# Patient Record
Sex: Male | Born: 1950
Health system: Southern US, Community
[De-identification: ages and names within clinical notes are randomized; demographics above are authoritative.]

## PROBLEM LIST (undated history)

## (undated) DIAGNOSIS — T4145XA Adverse effect of unspecified anesthetic, initial encounter: Secondary | ICD-10-CM

## (undated) DIAGNOSIS — I10 Essential (primary) hypertension: Secondary | ICD-10-CM

## (undated) DIAGNOSIS — M1990 Unspecified osteoarthritis, unspecified site: Secondary | ICD-10-CM

## (undated) DIAGNOSIS — E119 Type 2 diabetes mellitus without complications: Secondary | ICD-10-CM

## (undated) DIAGNOSIS — D1801 Hemangioma of skin and subcutaneous tissue: Secondary | ICD-10-CM

## (undated) DIAGNOSIS — M25569 Pain in unspecified knee: Secondary | ICD-10-CM

## (undated) DIAGNOSIS — T8859XA Other complications of anesthesia, initial encounter: Secondary | ICD-10-CM

## (undated) DIAGNOSIS — G8929 Other chronic pain: Secondary | ICD-10-CM

## (undated) DIAGNOSIS — K4090 Unilateral inguinal hernia, without obstruction or gangrene, not specified as recurrent: Secondary | ICD-10-CM

## (undated) DIAGNOSIS — K219 Gastro-esophageal reflux disease without esophagitis: Secondary | ICD-10-CM

## (undated) DIAGNOSIS — D649 Anemia, unspecified: Secondary | ICD-10-CM

## (undated) DIAGNOSIS — M549 Dorsalgia, unspecified: Secondary | ICD-10-CM

## (undated) DIAGNOSIS — K5790 Diverticulosis of intestine, part unspecified, without perforation or abscess without bleeding: Secondary | ICD-10-CM

## (undated) HISTORY — DX: Unilateral inguinal hernia, without obstruction or gangrene, not specified as recurrent: K40.90

## (undated) HISTORY — DX: Type 2 diabetes mellitus without complications: E11.9

## (undated) HISTORY — DX: Unspecified osteoarthritis, unspecified site: M19.90

## (undated) HISTORY — DX: Essential (primary) hypertension: I10

## (undated) HISTORY — DX: Other chronic pain: G89.29

## (undated) HISTORY — DX: Gastro-esophageal reflux disease without esophagitis: K21.9

## (undated) HISTORY — PX: HERNIA REPAIR: SHX51

## (undated) HISTORY — PX: KNEE SURGERY: SHX244

## (undated) HISTORY — DX: Dorsalgia, unspecified: M54.9

## (undated) HISTORY — PX: WISDOM TOOTH EXTRACTION: SHX21

## (undated) HISTORY — DX: Pain in unspecified knee: M25.569

## (undated) HISTORY — DX: Hemangioma of skin and subcutaneous tissue: D18.01

---

## 1994-08-16 HISTORY — PX: CATARACT EXTRACTION W/ INTRAOCULAR LENS  IMPLANT, BILATERAL: SHX1307

## 2001-04-03 ENCOUNTER — Ambulatory Visit (HOSPITAL_COMMUNITY): Admission: RE | Admit: 2001-04-03 | Discharge: 2001-04-03 | Payer: Self-pay | Admitting: Internal Medicine

## 2001-04-03 HISTORY — PX: COLONOSCOPY: SHX174

## 2003-06-25 ENCOUNTER — Ambulatory Visit (HOSPITAL_COMMUNITY): Admission: RE | Admit: 2003-06-25 | Discharge: 2003-06-25 | Payer: Self-pay | Admitting: Internal Medicine

## 2003-06-25 HISTORY — PX: ESOPHAGOGASTRODUODENOSCOPY: SHX1529

## 2010-09-06 ENCOUNTER — Encounter: Payer: Self-pay | Admitting: Internal Medicine

## 2011-07-13 ENCOUNTER — Telehealth: Payer: Self-pay

## 2011-07-13 NOTE — Telephone Encounter (Signed)
LMOM for pt to call to be triaged for a colonoscopy. ( He previously had EGD in 06/25/2003 but no colonoscopy)

## 2011-07-19 NOTE — Telephone Encounter (Signed)
Letter mailed to pt to call.  

## 2012-10-09 ENCOUNTER — Other Ambulatory Visit (HOSPITAL_COMMUNITY): Payer: Self-pay | Admitting: Physician Assistant

## 2012-10-09 DIAGNOSIS — R1031 Right lower quadrant pain: Secondary | ICD-10-CM

## 2012-10-11 ENCOUNTER — Ambulatory Visit (HOSPITAL_COMMUNITY)
Admission: RE | Admit: 2012-10-11 | Discharge: 2012-10-11 | Disposition: A | Discharge status: Home / Self Care | Payer: BC Managed Care – PPO | Source: Ambulatory Visit | Attending: Physician Assistant | Admitting: Physician Assistant

## 2012-10-11 DIAGNOSIS — R1031 Right lower quadrant pain: Secondary | ICD-10-CM | POA: Insufficient documentation

## 2012-10-11 DIAGNOSIS — K4090 Unilateral inguinal hernia, without obstruction or gangrene, not specified as recurrent: Secondary | ICD-10-CM | POA: Insufficient documentation

## 2012-10-11 DIAGNOSIS — K5730 Diverticulosis of large intestine without perforation or abscess without bleeding: Secondary | ICD-10-CM | POA: Insufficient documentation

## 2012-10-11 DIAGNOSIS — K429 Umbilical hernia without obstruction or gangrene: Secondary | ICD-10-CM | POA: Insufficient documentation

## 2012-10-11 DIAGNOSIS — N200 Calculus of kidney: Secondary | ICD-10-CM | POA: Insufficient documentation

## 2012-10-11 MED ORDER — IOHEXOL 300 MG/ML  SOLN
100.0000 mL | Freq: Once | INTRAMUSCULAR | Status: AC | PRN
  Administered 2012-10-11: 100 mL via INTRAVENOUS

## 2012-10-24 ENCOUNTER — Ambulatory Visit (INDEPENDENT_AMBULATORY_CARE_PROVIDER_SITE_OTHER): Payer: BC Managed Care – PPO | Admitting: Surgery

## 2012-10-31 ENCOUNTER — Ambulatory Visit (INDEPENDENT_AMBULATORY_CARE_PROVIDER_SITE_OTHER): Payer: BC Managed Care – PPO | Admitting: Urgent Care

## 2012-10-31 ENCOUNTER — Encounter: Payer: Self-pay | Admitting: Urgent Care

## 2012-10-31 VITALS — BP 153/86 | HR 125 | Temp 97.2°F | Ht 66.0 in | Wt 157.6 lb

## 2012-10-31 DIAGNOSIS — K5730 Diverticulosis of large intestine without perforation or abscess without bleeding: Secondary | ICD-10-CM

## 2012-10-31 DIAGNOSIS — R1031 Right lower quadrant pain: Secondary | ICD-10-CM

## 2012-10-31 DIAGNOSIS — Z1211 Encounter for screening for malignant neoplasm of colon: Secondary | ICD-10-CM

## 2012-10-31 MED ORDER — PEG 3350-KCL-NA BICARB-NACL 420 G PO SOLR: 4000.0000 mL | ORAL | Status: DC

## 2012-10-31 NOTE — Progress Notes (Signed)
Referring Provider: Lenise Herald, PA-C Primary Care Physician:  Lenise Herald, PA-C Primary Gastroenterologist:  Dr. Jena Gauss  Chief Complaint  Patient presents with  . Colonoscopy    HPI:  Lee Christensen is a 62 y.o. male here as a referral from Dr. Loreta Ave for colonoscopy.  He has been having abdominal pain so he was brought in for an office visiti to discuss prior to colonoscopy.  Pain is in his right lower quadrant & right groin x 32month.  He has been eating bland foods like jello, soup,chicken, & eggs.  Pain is constant.  Pain 6/10 at worst.  +Nagging pain.  Radiates to right testicle.  He has seen fresh bright red blood with wiping on tissue for years.  His weight is stable.   Denies heartburn, indigestion, nausea, vomiting, dysphagia, odynophagia or anorexia.  Tylenol helps the pain.  Lidocaine patch helps pain.  Pain seems to be getting better.  He has been referred by Dr Sherwood Gambler for upcoming appt with Dr Michaell Cowing, a surgeon in [place].    CT A/P withIv/oral contrast-Small umbilical and left inguinal hernias containing fat. Tiny nonobstructing left renal calculus in right renal cyst. Question small hiatal hernia. Minimal sigmoid diverticulosis. No acute intra abdominal or intrapelvic abnormalities.  Past Medical History  Diagnosis Date  . Hypertension   . Arthritis   . GERD (gastroesophageal reflux disease)   . Chronic back pain     Past Surgical History  Procedure Laterality Date  . Knee surgery      left x 2  . Hernia repair      ? lower abd  . Wisdom tooth extraction    . Esophagogastroduodenoscopy  06/25/03    ZOX:WRUEAV colored tongue-NO BARRETTs (EROSIVE ESOPHAGITIS)/small HH  . Colonoscopy  04/03/01    WUJ:[id number] hemorrhoids otherwise normal    Current Outpatient Prescriptions  Medication Sig Dispense Refill  . hydrochlorothiazide (HYDRODIURIL) 25 MG tablet Take 25 mg by mouth daily.       . irbesartan (AVAPRO) 150 MG tablet Take 150 mg by mouth daily.       .  lansoprazole (PREVACID) 30 MG capsule Take 30 mg by mouth daily.      Marland Kitchen lidocaine (LIDODERM) 5 % Place 1 patch onto the skin daily.       . polyethylene glycol-electrolytes (TRILYTE) 420 G solution Take 4,000 mLs by mouth as directed.  4000 mL  0   No current facility-administered medications for this visit.    Allergies as of 10/31/2012 - Review Complete 10/31/2012  Allergen Reaction Noted  . Penicillins Rash 10/31/2012    Family History:There is no known family history of colorectal carcinoma , liver disease, or inflammatory bowel disease.   Problem Relation Age of Onset  . Prostate cancer Father   . Lung cancer Mother     History   Social History  . Marital Status: Married    Spouse Name: N/A    Number of Children: 2  . Years of Education: N/A   Occupational History  . retired; [employer]    Social History Main Topics  . Smoking status: Former Smoker    Types: Cigarettes  . Smokeless tobacco: Former Neurosurgeon    Quit date: 11/01/1994  . Alcohol Use: Yes     Comment: rare Beer every couple months  . Drug Use: No  . Sexually Active: Not on file   Other Topics Concern  . Not on file   Social History Narrative   Lives w/ wife  Review of Systems: Gen: Denies any fever, chills, sweats, anorexia, fatigue, weakness, malaise, weight loss, and sleep disorder CV: Denies chest pain, angina, palpitations, syncope, orthopnea, PND, peripheral edema, and claudication. Resp: Denies dyspnea at rest, dyspnea with exercise, cough, sputum, wheezing, coughing up blood, and pleurisy. GI: Denies vomiting blood, jaundice, and fecal incontinence.   Denies dysphagia or odynophagia. GU : Denies urinary burning, blood in urine, urinary frequency, urinary hesitancy, nocturnal urination, and urinary incontinence. MS: chronic low back pain & sciatica  Derm: Denies rash, itching, dry skin, hives, moles, warts, or unhealing ulcers.  Psych: Denies depression, anxiety, memory loss, suicidal  ideation, hallucinations, paranoia, and confusion. Heme: Denies bruising, bleeding, and enlarged lymph nodes. Neuro:  Denies any headaches, dizziness, paresthesias. Endo:  Denies any problems with DM, thyroid, adrenal function.  Physical Exam: BP 153/86  Pulse 125  Temp(Src) 97.2 F (36.2 C) (Oral)  Ht 5\' 6"  (1.676 m)  Wt 157 lb 9.6 oz (71.487 kg)  BMI 25.45 kg/m2 No LMP for male patient. General:   Alert,  Well-developed, well-nourished, pleasant and cooperative in NAD.  Accompanied by his lovely wife. Head:  Normocephalic and atraumatic. Eyes:  Sclera clear, no icterus.   Conjunctiva pink. Ears:  Normal auditory acuity. Nose:  No deformity, discharge, or lesions. Mouth:  No deformity or lesions,oropharynx pink & moist. Neck:  Supple; no masses or thyromegaly. Lungs:  Clear throughout to auscultation.   No wheezes, crackles, or rhonchi. No acute distress. Heart:  Regular rate and rhythm; no murmurs, clicks, rubs,  or gallops. Abdomen:  Normal bowel sounds.  No bruits.  Soft, non-tender and non-distended without masses, hepatosplenomegaly.  +easily reducible small umbilical hernia.   No RLQ/inguinal hernia appreciated.  +Point tenderness to small musculature deficit RLQ. No guarding or rebound tenderness.   Rectal:  Deferred. Msk:  Symmetrical without gross deformities. Normal posture. Pulses:  Normal pulses noted. Extremities:  No clubbing or edema. Neurologic:  Alert and oriented x4;  grossly normal neurologically. Skin:  Intact without significant lesions or rashes. Lymph Nodes:  No significant cervical adenopathy. Psych:  Alert and cooperative. Normal mood and affect.

## 2012-10-31 NOTE — Patient Instructions (Addendum)
Colonoscopy with Dr Jena Gauss Continue metamucil or fiber supplement of choice daily High fiber diet Diverticulosis Diverticulosis is a common condition that develops when small pouches (diverticula) form in the wall of the colon. The risk of diverticulosis increases with age. It happens more often in people who eat a low-fiber diet. Most individuals with diverticulosis have no symptoms. Those individuals with symptoms usually experience abdominal pain, constipation, or loose stools (diarrhea). HOME CARE INSTRUCTIONS   Increase the amount of fiber in your diet as directed by your caregiver or dietician. This may reduce symptoms of diverticulosis.  Your caregiver may recommend taking a dietary fiber supplement.  Drink at least 6 to 8 glasses of water each day to prevent constipation.  Try not to strain when you have a bowel movement.  Your caregiver may recommend avoiding nuts and seeds to prevent complications, although this is still an uncertain benefit.  Only take over-the-counter or prescription medicines for pain, discomfort, or fever as directed by your caregiver. FOODS WITH HIGH FIBER CONTENT INCLUDE:  Fruits. Apple, peach, pear, tangerine, raisins, prunes.  Vegetables. Brussels sprouts, asparagus, broccoli, cabbage, carrot, cauliflower, romaine lettuce, spinach, summer squash, tomato, winter squash, zucchini.  Starchy Vegetables. Baked beans, kidney beans, lima beans, split peas, lentils, potatoes (with skin).  Grains. Whole wheat bread, brown rice, bran flake cereal, plain oatmeal, white rice, shredded wheat, bran muffins. SEEK IMMEDIATE MEDICAL CARE IF:   You develop increasing pain or severe bloating.  You have an oral temperature above 102 F (38.9 C), not controlled by medicine.  You develop vomiting or bowel movements that are bloody or black. Document Released: 04/29/2004 Document Revised: 10/25/2011 Document Reviewed: 12/31/2009 [facility] Patient Information 2013  [place], [place]. High-Fiber Diet Fiber is found in fruits, vegetables, and grains. A high-fiber diet encourages the addition of more whole grains, legumes, fruits, and vegetables in your diet. The recommended amount of fiber for adult males is 38 g per day. For adult females, it is 25 g per day. Pregnant and lactating women should get 28 g of fiber per day. If you have a digestive or bowel problem, ask your caregiver for advice before adding high-fiber foods to your diet. Eat a variety of high-fiber foods instead of only a select few type of foods.  PURPOSE  To increase stool bulk.  To make bowel movements more regular to prevent constipation.  To lower cholesterol.  To prevent overeating. WHEN IS THIS DIET USED?  It may be used if you have constipation and hemorrhoids.  It may be used if you have uncomplicated diverticulosis (intestine condition) and irritable bowel syndrome.  It may be used if you need help with weight management.  It may be used if you want to add it to your diet as a protective measure against atherosclerosis, diabetes, and cancer. SOURCES OF FIBER  Whole-grain breads and cereals.  Fruits, such as apples, oranges, bananas, berries, prunes, and pears.  Vegetables, such as green peas, carrots, sweet potatoes, beets, broccoli, cabbage, spinach, and artichokes.  Legumes, such split peas, soy, lentils.  Almonds. FIBER CONTENT IN FOODS Starches and Grains / Dietary Fiber (g)  Cheerios, 1 cup / 3 g  Corn Flakes cereal, 1 cup / 0.7 g  Rice crispy treat cereal, 1 cup / 0.3 g  Instant oatmeal (cooked),  cup / 2 g  Frosted wheat cereal, 1 cup / 5.1 g  Brown, long-grain rice (cooked), 1 cup / 3.5 g  White, long-grain rice (cooked), 1 cup / 0.6 g  Enriched  macaroni (cooked), 1 cup / 2.5 g Legumes / Dietary Fiber (g)  Baked beans (canned, plain, or vegetarian),  cup / 5.2 g  Kidney beans (canned),  cup / 6.8 g  Pinto beans (cooked),  cup / 5.5  g Breads and Crackers / Dietary Fiber (g)  Plain or honey graham crackers, 2 squares / 0.7 g  Saltine crackers, 3 squares / 0.3 g  Plain, salted pretzels, 10 pieces / 1.8 g  Whole-wheat bread, 1 slice / 1.9 g  White bread, 1 slice / 0.7 g  Raisin bread, 1 slice / 1.2 g  Plain bagel, 3 oz / 2 g  Flour tortilla, 1 oz / 0.9 g  Corn tortilla, 1 small / 1.5 g  Hamburger or hotdog bun, 1 small / 0.9 g Fruits / Dietary Fiber (g)  Apple with skin, 1 medium / 4.4 g  Sweetened applesauce,  cup / 1.5 g  Banana,  medium / 1.5 g  Grapes, 10 grapes / 0.4 g  Orange, 1 small / 2.3 g  Raisin, 1.5 oz / 1.6 g  Melon, 1 cup / 1.4 g Vegetables / Dietary Fiber (g)  Green beans (canned),  cup / 1.3 g  Carrots (cooked),  cup / 2.3 g  Broccoli (cooked),  cup / 2.8 g  Peas (cooked),  cup / 4.4 g  Mashed potatoes,  cup / 1.6 g  Lettuce, 1 cup / 0.5 g  Corn (canned),  cup / 1.6 g  Tomato,  cup / 1.1 g Document Released: 08/02/2005 Document Revised: 02/01/2012 Document Reviewed: 11/04/2011 [facility] Patient Information 2013 [place], [place].

## 2012-11-01 ENCOUNTER — Encounter: Payer: Self-pay | Admitting: Urgent Care

## 2012-11-01 NOTE — Assessment & Plan Note (Signed)
Diverticulosis literature Continue metamucil or fiber supplement of choice daily High fiber diet

## 2012-11-01 NOTE — Assessment & Plan Note (Addendum)
I suspect either muscle strain RLQ or small hernia not picked up on CT as culprit of RLQ pain.  Cannot r/o testicular source.  If pain persists, I fully agree with surgical consult after colonoscopy.  Advised to have complete testicular exam through PCP as well.  If severe pain, to ER

## 2012-11-01 NOTE — Assessment & Plan Note (Signed)
Colonoscopy with Dr Jena Gauss.  I have discussed risks & benefits which include, but are not limited to, bleeding, infection, perforation & drug reaction.  The patient agrees with this plan & written consent will be obtained.

## 2012-11-02 ENCOUNTER — Encounter (HOSPITAL_COMMUNITY): Payer: Self-pay | Admitting: Pharmacy Technician

## 2012-11-02 NOTE — Progress Notes (Signed)
Faxed to PCP

## 2012-11-09 ENCOUNTER — Encounter (HOSPITAL_COMMUNITY)
Admission: RE | Disposition: A | Discharge status: Home / Self Care | Payer: Self-pay | Source: Ambulatory Visit | Attending: Internal Medicine

## 2012-11-09 ENCOUNTER — Encounter (HOSPITAL_COMMUNITY): Payer: Self-pay | Admitting: *Deleted

## 2012-11-09 ENCOUNTER — Ambulatory Visit (HOSPITAL_COMMUNITY)
Admission: RE | Admit: 2012-11-09 | Discharge: 2012-11-09 | Disposition: A | Discharge status: Home / Self Care | Payer: BC Managed Care – PPO | Source: Ambulatory Visit | Attending: Internal Medicine | Admitting: Internal Medicine

## 2012-11-09 DIAGNOSIS — K5730 Diverticulosis of large intestine without perforation or abscess without bleeding: Secondary | ICD-10-CM

## 2012-11-09 DIAGNOSIS — Z87891 Personal history of nicotine dependence: Secondary | ICD-10-CM | POA: Insufficient documentation

## 2012-11-09 DIAGNOSIS — K429 Umbilical hernia without obstruction or gangrene: Secondary | ICD-10-CM | POA: Insufficient documentation

## 2012-11-09 DIAGNOSIS — M129 Arthropathy, unspecified: Secondary | ICD-10-CM | POA: Insufficient documentation

## 2012-11-09 DIAGNOSIS — Z880 Allergy status to penicillin: Secondary | ICD-10-CM | POA: Insufficient documentation

## 2012-11-09 DIAGNOSIS — I10 Essential (primary) hypertension: Secondary | ICD-10-CM | POA: Insufficient documentation

## 2012-11-09 DIAGNOSIS — K219 Gastro-esophageal reflux disease without esophagitis: Secondary | ICD-10-CM | POA: Insufficient documentation

## 2012-11-09 DIAGNOSIS — K4090 Unilateral inguinal hernia, without obstruction or gangrene, not specified as recurrent: Secondary | ICD-10-CM | POA: Insufficient documentation

## 2012-11-09 DIAGNOSIS — Z79899 Other long term (current) drug therapy: Secondary | ICD-10-CM | POA: Insufficient documentation

## 2012-11-09 DIAGNOSIS — Z1211 Encounter for screening for malignant neoplasm of colon: Secondary | ICD-10-CM

## 2012-11-09 HISTORY — PX: COLONOSCOPY: SHX5424

## 2012-11-09 SURGERY — COLONOSCOPY
Anesthesia: Moderate Sedation

## 2012-11-09 MED ORDER — MIDAZOLAM HCL 5 MG/5ML IJ SOLN
INTRAMUSCULAR | Status: AC
  Filled 2012-11-09: qty 10

## 2012-11-09 MED ORDER — MEPERIDINE HCL 100 MG/ML IJ SOLN
INTRAMUSCULAR | Status: AC
  Filled 2012-11-09: qty 1

## 2012-11-09 MED ORDER — SODIUM CHLORIDE 0.9 % IV SOLN
INTRAVENOUS | Status: DC
  Administered 2012-11-09: 10:00:00 via INTRAVENOUS

## 2012-11-09 MED ORDER — ONDANSETRON HCL 4 MG/2ML IJ SOLN
INTRAMUSCULAR | Status: DC | PRN
  Administered 2012-11-09: 4 mg via INTRAVENOUS

## 2012-11-09 MED ORDER — MEPERIDINE HCL 100 MG/ML IJ SOLN
INTRAMUSCULAR | Status: DC | PRN
  Administered 2012-11-09: 50 mg via INTRAVENOUS
  Administered 2012-11-09: 25 mg via INTRAVENOUS
  Administered 2012-11-09: 50 mg via INTRAVENOUS

## 2012-11-09 MED ORDER — ONDANSETRON HCL 4 MG/2ML IJ SOLN
INTRAMUSCULAR | Status: AC
  Filled 2012-11-09: qty 2

## 2012-11-09 MED ORDER — STERILE WATER FOR IRRIGATION IR SOLN
Status: DC | PRN
  Administered 2012-11-09: 12:00:00

## 2012-11-09 MED ORDER — MIDAZOLAM HCL 5 MG/5ML IJ SOLN
INTRAMUSCULAR | Status: DC | PRN
  Administered 2012-11-09: 2 mg via INTRAVENOUS
  Administered 2012-11-09: 1 mg via INTRAVENOUS
  Administered 2012-11-09: 2 mg via INTRAVENOUS

## 2012-11-09 NOTE — H&P (View-Only) (Signed)
Referring Provider: Mann, Benjamin, PA-C Primary Care Physician:  MANN, BENJAMIN, PA-C Primary Gastroenterologist:  Dr. Rourk  Chief Complaint  Patient presents with  . Colonoscopy    HPI:  Lee Christensen is a 62 y.o. male here as a referral from Dr. Mann for colonoscopy.  He has been having abdominal pain so he was brought in for an office visiti to discuss prior to colonoscopy.  Pain is in his right lower quadrant & right groin x 1month.  He has been eating bland foods like jello, soup,chicken, & eggs.  Pain is constant.  Pain 6/10 at worst.  +Nagging pain.  Radiates to right testicle.  He has seen fresh bright red blood with wiping on tissue for years.  His weight is stable.   Denies heartburn, indigestion, nausea, vomiting, dysphagia, odynophagia or anorexia.  Tylenol helps the pain.  Lidocaine patch helps pain.  Pain seems to be getting better.  He has been referred by Dr Fusco for upcoming appt with Dr Gross, a surgeon in [place].    CT A/P withIv/oral contrast-Small umbilical and left inguinal hernias containing fat. Tiny nonobstructing left renal calculus in right renal cyst. Question small hiatal hernia. Minimal sigmoid diverticulosis. No acute intra abdominal or intrapelvic abnormalities.  Past Medical History  Diagnosis Date  . Hypertension   . Arthritis   . GERD (gastroesophageal reflux disease)   . Chronic back pain     Past Surgical History  Procedure Laterality Date  . Knee surgery      left x 2  . Hernia repair      ? lower abd  . Wisdom tooth extraction    . Esophagogastroduodenoscopy  06/25/03    RMR:salmon colored tongue-NO BARRETTs (EROSIVE ESOPHAGITIS)/small HH  . Colonoscopy  04/03/01    RMR:internal hemorrhoids otherwise normal    Current Outpatient Prescriptions  Medication Sig Dispense Refill  . hydrochlorothiazide (HYDRODIURIL) 25 MG tablet Take 25 mg by mouth daily.       . irbesartan (AVAPRO) 150 MG tablet Take 150 mg by mouth daily.       .  lansoprazole (PREVACID) 30 MG capsule Take 30 mg by mouth daily.      . lidocaine (LIDODERM) 5 % Place 1 patch onto the skin daily.       . polyethylene glycol-electrolytes (TRILYTE) 420 G solution Take 4,000 mLs by mouth as directed.  4000 mL  0   No current facility-administered medications for this visit.    Allergies as of 10/31/2012 - Review Complete 10/31/2012  Allergen Reaction Noted  . Penicillins Rash 10/31/2012    Family History:There is no known family history of colorectal carcinoma , liver disease, or inflammatory bowel disease.   Problem Relation Age of Onset  . Prostate cancer Father   . Lung cancer Mother     History   Social History  . Marital Status: Married    Spouse Name: N/A    Number of Children: 2  . Years of Education: N/A   Occupational History  . retired; hardwood flooring    Social History Main Topics  . Smoking status: Former Smoker    Types: Cigarettes  . Smokeless tobacco: Former User    Quit date: 11/01/1994  . Alcohol Use: Yes     Comment: rare Beer every couple months  . Drug Use: No  . Sexually Active: Not on file   Other Topics Concern  . Not on file   Social History Narrative   Lives w/ wife      Review of Systems: Gen: Denies any fever, chills, sweats, anorexia, fatigue, weakness, malaise, weight loss, and sleep disorder CV: Denies chest pain, angina, palpitations, syncope, orthopnea, PND, peripheral edema, and claudication. Resp: Denies dyspnea at rest, dyspnea with exercise, cough, sputum, wheezing, coughing up blood, and pleurisy. GI: Denies vomiting blood, jaundice, and fecal incontinence.   Denies dysphagia or odynophagia. GU : Denies urinary burning, blood in urine, urinary frequency, urinary hesitancy, nocturnal urination, and urinary incontinence. MS: chronic low back pain & sciatica  Derm: Denies rash, itching, dry skin, hives, moles, warts, or unhealing ulcers.  Psych: Denies depression, anxiety, memory loss, suicidal  ideation, hallucinations, paranoia, and confusion. Heme: Denies bruising, bleeding, and enlarged lymph nodes. Neuro:  Denies any headaches, dizziness, paresthesias. Endo:  Denies any problems with DM, thyroid, adrenal function.  Physical Exam: BP 153/86  Pulse 125  Temp(Src) 97.2 F (36.2 C) (Oral)  Ht 5' 6" (1.676 m)  Wt 157 lb 9.6 oz (71.487 kg)  BMI 25.45 kg/m2 No LMP for male patient. General:   Alert,  Well-developed, well-nourished, pleasant and cooperative in NAD.  Accompanied by his lovely wife. Head:  Normocephalic and atraumatic. Eyes:  Sclera clear, no icterus.   Conjunctiva pink. Ears:  Normal auditory acuity. Nose:  No deformity, discharge, or lesions. Mouth:  No deformity or lesions,oropharynx pink & moist. Neck:  Supple; no masses or thyromegaly. Lungs:  Clear throughout to auscultation.   No wheezes, crackles, or rhonchi. No acute distress. Heart:  Regular rate and rhythm; no murmurs, clicks, rubs,  or gallops. Abdomen:  Normal bowel sounds.  No bruits.  Soft, non-tender and non-distended without masses, hepatosplenomegaly.  +easily reducible small umbilical hernia.   No RLQ/inguinal hernia appreciated.  +Point tenderness to small musculature deficit RLQ. No guarding or rebound tenderness.   Rectal:  Deferred. Msk:  Symmetrical without gross deformities. Normal posture. Pulses:  Normal pulses noted. Extremities:  No clubbing or edema. Neurologic:  Alert and oriented x4;  grossly normal neurologically. Skin:  Intact without significant lesions or rashes. Lymph Nodes:  No significant cervical adenopathy. Psych:  Alert and cooperative. Normal mood and affect.  

## 2012-11-09 NOTE — Op Note (Signed)
[facility] [address] [place], [address]   COLONOSCOPY PROCEDURE REPORT  PATIENT: Lee Christensen, Lee Christensen  MR#:         [id number] BIRTHDATE: 05/08/1951 , 62  yrs. old GENDER: Male ENDOSCOPIST: R.  Roetta Sessions, MD FACP FACG REFERRED BY:  Artis Delay, M.D. PROCEDURE DATE:  11/09/2012 PROCEDURE:     Screening ileocolonoscopy  INDICATIONS:  Average risk screening examination  INFORMED CONSENT:  The risks, benefits, alternatives and imponderables including but not limited to bleeding, perforation as well as the possibility of a missed lesion have been reviewed.  The potential for biopsy, lesion removal, etc. have also been discussed.  Questions have been answered.  All parties agreeable. Please see the history and physical in the medical record for more information.  MEDICATIONS: Versed 5 mg IV and Demerol 125 mg IV in divided doses. Zofran 4 mg IV  DESCRIPTION OF PROCEDURE:  After a digital rectal exam was performed, the EC-[id number] ([id number])  colonoscope was advanced from the anus through the rectum and colon to the area of the cecum, ileocecal valve and appendiceal orifice.  The cecum was deeply intubated.  These structures were well-seen and photographed for the record.  From the level of the cecum and ileocecal valve, the scope was slowly and cautiously withdrawn.  The mucosal surfaces were carefully surveyed utilizing scope tip deflection to facilitate fold flattening as needed.  The scope was pulled down into the rectum where a thorough examination including retroflexion was performed.    FINDINGS:  Adequate preparation. Normal rectum. Scattered left-sided diverticula; the remainder of colonic mucosa appeared normal. The distal 10 cm of terminal ileum mucosa also appeared normal.  THERAPEUTIC / DIAGNOSTIC MANEUVERS PERFORMED:  None  COMPLICATIONS: None  CECAL WITHDRAWAL TIME:  9 minutes  IMPRESSION:  Colonic diverticulosis.  RECOMMENDATIONS:   Repeat  screening colonoscopy in 10 years. Agree with seeing a general surgeon to further evaluate groin pain.   _______________________________ eSigned:  R. Roetta Sessions, MD FACP [facility] 11/09/2012 12:25 PM   CC:    PATIENT NAME:  Quitman, Norberto MR#: [id number]

## 2012-11-09 NOTE — Interval H&P Note (Signed)
History and Physical Interval Note:  11/09/2012 11:47 AM  Lee Christensen  has presented today for surgery, with the diagnosis of SCREENING COLONOSCOPY  The various methods of treatment have been discussed with the patient and family. After consideration of risks, benefits and other options for treatment, the patient has consented to  Procedure(s) with comments: COLONOSCOPY (N/A) - 10:30 as a surgical intervention .  The patient's history has been reviewed, patient examined, no change in status, stable for surgery.  I have reviewed the patient's chart and labs.  Questions were answered to the patient's satisfaction.     Lee Christensen Colonoscopy per plan. Patient states of right lower quadrant bowel pain has improved significantly but he is planning to see a general surgeon anyway.The risks, benefits, limitations, alternatives and imponderables have been reviewed with the patient. Questions have been answered. All parties are agreeable.

## 2012-11-13 ENCOUNTER — Encounter (HOSPITAL_COMMUNITY): Payer: Self-pay | Admitting: Internal Medicine

## 2012-12-04 ENCOUNTER — Encounter (INDEPENDENT_AMBULATORY_CARE_PROVIDER_SITE_OTHER): Payer: Self-pay | Admitting: Surgery

## 2012-12-04 ENCOUNTER — Ambulatory Visit (INDEPENDENT_AMBULATORY_CARE_PROVIDER_SITE_OTHER): Payer: BC Managed Care – PPO | Admitting: Surgery

## 2012-12-04 VITALS — BP 150/82 | HR 83 | Temp 98.1°F | Resp 18 | Ht 66.0 in | Wt 157.0 lb

## 2012-12-04 DIAGNOSIS — K4090 Unilateral inguinal hernia, without obstruction or gangrene, not specified as recurrent: Secondary | ICD-10-CM

## 2012-12-04 DIAGNOSIS — K4020 Bilateral inguinal hernia, without obstruction or gangrene, not specified as recurrent: Secondary | ICD-10-CM | POA: Insufficient documentation

## 2012-12-04 DIAGNOSIS — S39013A Strain of muscle, fascia and tendon of pelvis, initial encounter: Secondary | ICD-10-CM | POA: Insufficient documentation

## 2012-12-04 DIAGNOSIS — IMO0002 Reserved for concepts with insufficient information to code with codable children: Secondary | ICD-10-CM

## 2012-12-04 DIAGNOSIS — K429 Umbilical hernia without obstruction or gangrene: Secondary | ICD-10-CM

## 2012-12-04 HISTORY — DX: Unilateral inguinal hernia, without obstruction or gangrene, not specified as recurrent: K40.90

## 2012-12-04 NOTE — Progress Notes (Signed)
Subjective:     Patient ID: Lee Christensen, male   DOB: 1951-03-10, 62 y.o.   MRN: [id number]  HPI  Gerik Coberly Birkland  May 08, 1951 [id number]  Patient Care Team: Elfredia Nevins, MD as PCP - General (Internal Medicine) Corbin Ade, MD as Consulting Physician (Gastroenterology) Lenise Herald, PA-C as Physician Assistant (Family Medicine) Corrie Mckusick, MD as Attending Physician (Family Medicine)  This patient is a 62 y.o.male who presents today for surgical evaluation at the request of Dwyane Luo / Dr. Phillips Odor.   Reason for visit: Right groin strain.  Question of hernia.  Umbilical and left inguinal hernias found on CAT scan.  Pleasant active male.  Recently retired.  Had a prior (inguinal hernia repair with mesh in the late 1990s.  He cannot recall the surgeon.  Noted a sharp lower groin pain in February.  Saw his primary care physician.  CT scan done.  Left kidney stone 3mm at the hilum.  Small left inguinal and umbilical hernias.  No right inguinal hernia seen.  The pain is gone down to more of an intermittent state.  It is worse when he coughs or when he is lifting or active.  He was sent to me for evaluation to see if he had hernia problems requiring surgery.  He used to walk a few miles a day.  However, his left knee has more severe [place] to arthritis and cannot walk more than a mile now.  Has a few bowel movements in the morning but otherwise okay.  Diverticulosis on recent colonoscopy.  No abdominal surgeries aside from a right inguinal hernia repair.  Patient Active Problem List  Diagnosis  . Encounter for screening colonoscopy  . RLQ abdominal pain  . Diverticulosis of sigmoid colon    Past Medical History  Diagnosis Date  . Hypertension   . Arthritis   . GERD (gastroesophageal reflux disease)   . Chronic back pain   . Diabetes mellitus without complication     Borderline.  . Diverticulitis   . Knee pain     Past Surgical History  Procedure Laterality Date  . Knee surgery     left x 2  . Hernia repair      ? lower abd  . Wisdom tooth extraction    . Esophagogastroduodenoscopy  06/25/03    NGE:XBMWUX colored tongue-NO BARRETTs (EROSIVE ESOPHAGITIS)/small HH  . Colonoscopy  04/03/01    LKG:MWNUUVOZ hemorrhoids otherwise normal  . Colonoscopy N/A 11/09/2012    Procedure: COLONOSCOPY;  Surgeon: Corbin Ade, MD;  Location: [facility];  Service: Endoscopy;  Laterality: N/A;  10:30  . Cataract extraction w/ intraocular lens  implant, bilateral  1996    History   Social History  . Marital Status: Married    Spouse Name: N/A    Number of Children: 2  . Years of Education: N/A   Occupational History  . retired; [employer]    Social History Main Topics  . Smoking status: Former Smoker    Types: Cigarettes    Quit date: 08/16/1996  . Smokeless tobacco: Former Neurosurgeon    Types: Chew    Quit date: 11/01/1994  . Alcohol Use: 0.6 oz/week    1 Cans of beer per week     Comment: Rarely.  . Drug Use: No  . Sexually Active: Not on file   Other Topics Concern  . Not on file   Social History Narrative   Lives w/ wife    Family History  Problem Relation Age of Onset  . Prostate cancer Father   . Diabetes Father   . Hypertension Father   . Stroke Father   . Lung cancer Mother     Current Outpatient Prescriptions  Medication Sig Dispense Refill  . acetaminophen (TYLENOL) 500 MG tablet Take 500 mg by mouth every 6 (six) hours as needed for pain.      . hydrochlorothiazide (HYDRODIURIL) 25 MG tablet Take 25 mg by mouth daily.       . irbesartan (AVAPRO) 150 MG tablet Take 150 mg by mouth daily.       . iron polysaccharides (NIFEREX) 150 MG capsule Take 150 mg by mouth 2 (two) times daily.      . lansoprazole (PREVACID) 30 MG capsule Take 30 mg by mouth daily.      Marland Kitchen lidocaine (LIDODERM) 5 % Place 1 patch onto the skin daily as needed (Pain).       . psyllium (METAMUCIL SMOOTH TEXTURE) 28 % packet Take 1 packet by mouth daily.       No current  facility-administered medications for this visit.     Allergies  Allergen Reactions  . Penicillins Rash    BP 150/82  Pulse 83  Temp(Src) 98.1 F (36.7 C) (Temporal)  Resp 18  Ht 5\' 6"  (1.676 m)  Wt 157 lb (71.215 kg)  BMI 25.35 kg/m2  No results found.   Review of Systems  Constitutional: Negative for fever, chills and diaphoresis.  HENT: Negative for nosebleeds, sore throat, facial swelling, mouth sores, trouble swallowing and ear discharge.   Eyes: Negative for photophobia, discharge and visual disturbance.  Respiratory: Negative for choking, chest tightness, shortness of breath and stridor.   Cardiovascular: Negative for chest pain and palpitations.  Gastrointestinal: Negative for nausea, vomiting, abdominal pain, diarrhea, constipation, blood in stool, abdominal distention, anal bleeding and rectal pain.  Genitourinary: Negative for dysuria, urgency, difficulty urinating and testicular pain.  Musculoskeletal: Negative for myalgias, back pain, arthralgias and gait problem.  Skin: Negative for color change, pallor, rash and wound.  Neurological: Negative for dizziness, speech difficulty, weakness, numbness and headaches.  Hematological: Negative for adenopathy. Does not bruise/bleed easily.  Psychiatric/Behavioral: Negative for hallucinations, confusion and agitation.       Objective:   Physical Exam  Constitutional: He is oriented to person, place, and time. He appears well-developed and well-nourished. No distress.  HENT:  Head: Normocephalic.  Mouth/Throat: Oropharynx is clear and moist. No oropharyngeal exudate.  Eyes: Conjunctivae and EOM are normal. Pupils are equal, round, and reactive to light. No scleral icterus.  Neck: Normal range of motion. Neck supple. No tracheal deviation present.  Cardiovascular: Normal rate, regular rhythm and intact distal pulses.   Pulmonary/Chest: Effort normal and breath sounds normal. No respiratory distress.  Abdominal: Soft.  Bowel sounds are normal. He exhibits no distension and no abdominal bruit. There is no rigidity, no guarding, no CVA tenderness, no tenderness at McBurney's point and negative Murphy's sign. A hernia is present. Hernia confirmed positive in the ventral area and confirmed positive in the left inguinal area. Hernia confirmed negative in the right inguinal area.    Musculoskeletal: Normal range of motion. He exhibits no tenderness.  Lymphadenopathy:    He has no cervical adenopathy.       Right: No inguinal adenopathy present.       Left: No inguinal adenopathy present.  Neurological: He is alert and oriented to person, place, and time. No cranial nerve deficit. He exhibits normal  muscle tone. Coordination normal.  Skin: Skin is warm and dry. No rash noted. He is not diaphoretic. No erythema. No pallor.  Psychiatric: He has a normal mood and affect. His behavior is normal. Judgment and thought content normal.       Assessment:     Right groin strain and pain.  Most consistent with musculoskeletal strain of old scar.  No strong evidence of hernia.  Small but definite left inguinal hernia.  Small umbilical hernia.     Plan:     At this point, I think his right groin strain is due to a pull.  Recommended heat and anti-inflammatories (naproxen two by mouth twice a day).  Do that for three weeks.  That should help musculoskeletal discomfort calm down.  It could be a subtle recurrent RIH hernia, but exam and CT scan not definitive.  The most aggressive option would be to do diagnostic laparoscopy at repair [facility] if seen.  However, because the pain is getting less, I think it is reasonable to follow with anti-inflammatory regimen and see if it improves.  No strong trigger point that would benefit from a nerve block at this time.  At some point, his left inguinal hernia will need to be repaired.  I did offer diagnostic laparoscopy with repair of hernias.  I could do a primary repair of the iumbilical  hernia as well.  He seemed inclined to maybe do it now vs. waiting until the fall.  He wanted to leave the summer opened be active.  I did discuss procedure with him:  The anatomy & physiology of the abdominal wall and pelvic floor was discussed.  The pathophysiology of hernias in the inguinal and pelvic region was discussed.  Natural history risks such as progressive enlargement, pain, incarceration & strangulation was discussed.   Contributors to complications such as smoking, obesity, diabetes, prior surgery, etc were discussed.    I feel the risks of no intervention will lead to serious problems that outweigh the operative risks; therefore, I recommended surgery to reduce and repair the hernia.  I explained laparoscopic techniques with possible need for an open approach.  I noted usual use of mesh to patch and/or buttress hernia repair  Risks such as bleeding, infection, abscess, need for further treatment, heart attack, death, and other risks were discussed.  I noted a good likelihood this will help address the problem.   Goals of post-operative recovery were discussed as well.  Possibility that this will not correct all symptoms was explained.  I stressed the importance of low-impact activity, aggressive pain control, avoiding constipation, & not pushing through pain to minimize risk of post-operative chronic pain or injury. Possibility of reherniation was discussed.  We will work to minimize complications.     An educational handout further explaining the pathology & treatment options was given as well.  Questions were answered.  The patient expresses understanding & wishes to think about things before planning surgery.

## 2012-12-04 NOTE — Patient Instructions (Signed)
See the Handout(s) we gave you.  Consider surgery To repair the hernia in her left groin and make sure you do not have her recurrent right groin hernia.  Okay to delay surgery until soreness in the right side calms down if you wish.  Use naproxen 500 mg twice a day and heat six times a day to help.  Please call our office at [phone] if you wish to schedule surgery or if you have further questions / concerns.   Inguinal Strain Your exam shows you have an inguinal strain. This is also known as a pulled groin. This injury is usually due to a pull or partial tear to a muscle or tendon in the groin area. Most groin pulls take several weeks to heal completely. There may be pain with lifting your leg or walking during much of your recovery. Treatment for groin strains includes:  Rest and avoid lifting or performing activities that increase your pain.  Apply ice packs for 20-30 minutes every few hours to reduce pain and swelling over the next 2-3 days.  Medicine to reduce pain and inflammation is often prescribed. HOME CARE INSTRUCTIONS  While most strains in the groin area will heal with rest, you should also watch for any signs of a more serious condition.  SEEK IMMEDIATE MEDICAL CARE IF:   You notice unusual swelling or bulging in the groin.  You have pain or swelling in the testicle.  Blood in your urine.  Marked increased pain.  Weakness or numbness of your leg or abdominal pain. MAKE SURE YOU:   Understand these instructions.  Will watch your condition.  Will get help right away if you are not doing well or get worse. Document Released: 09/09/2004 Document Revised: 10/25/2011 Document Reviewed: 12/07/2007 [facility] Patient Information 2013 [place], [place].  Hernia A hernia occurs when an internal organ pushes out through a weak spot in the abdominal wall. Hernias most commonly occur in the groin and around the navel. Hernias often can be pushed back into place (reduced). Most  hernias tend to get worse over time. Some abdominal hernias can get stuck in the opening (irreducible or incarcerated hernia) and cannot be reduced. An irreducible abdominal hernia which is tightly squeezed into the opening is at risk for impaired blood supply (strangulated hernia). A strangulated hernia is a medical emergency. Because of the risk for an irreducible or strangulated hernia, surgery may be recommended to repair a hernia. CAUSES   Heavy lifting.  Prolonged coughing.  Straining to have a bowel movement.  A cut (incision) made during an abdominal surgery. HOME CARE INSTRUCTIONS   Bed rest is not required. You may continue your normal activities.  Avoid lifting more than 10 pounds (4.5 kg) or straining.  Cough gently. If you are a smoker it is best to stop. Even the best hernia repair can break down with the continual strain of coughing. Even if you do not have your hernia repaired, a cough will continue to aggravate the problem.  Do not wear anything tight over your hernia. Do not try to keep it in with an outside bandage or truss. These can damage abdominal contents if they are trapped within the hernia sac.  Eat a normal diet.  Avoid constipation. Straining over long periods of time will increase hernia size and encourage breakdown of repairs. If you cannot do this with diet alone, stool softeners may be used. SEEK IMMEDIATE MEDICAL CARE IF:   You have a fever.  You develop increasing  abdominal pain.  You feel nauseous or vomit.  Your hernia is stuck outside the abdomen, looks discolored, feels hard, or is tender.  You have any changes in your bowel habits or in the hernia that are unusual for you.  You have increased pain or swelling around the hernia.  You cannot push the hernia back in place by applying gentle pressure while lying down. MAKE SURE YOU:   Understand these instructions.  Will watch your condition.  Will get help right away if you are not  doing well or get worse. Document Released: 08/02/2005 Document Revised: 10/25/2011 Document Reviewed: 03/21/2008 [facility] Patient Information 2013 [place], [place].

## 2012-12-06 ENCOUNTER — Other Ambulatory Visit (INDEPENDENT_AMBULATORY_CARE_PROVIDER_SITE_OTHER): Payer: Self-pay | Admitting: Surgery

## 2012-12-12 ENCOUNTER — Encounter (HOSPITAL_COMMUNITY): Payer: Self-pay | Admitting: Pharmacy Technician

## 2012-12-13 ENCOUNTER — Telehealth (INDEPENDENT_AMBULATORY_CARE_PROVIDER_SITE_OTHER): Payer: Self-pay

## 2012-12-13 NOTE — Telephone Encounter (Signed)
Pt called asking Dr Michaell Cowing could take off a cyst of his left shoulder while he is already scheduled for bilateral and umbilical hernia repair to be done on 12/29/12. The pt states he has had this cyst for a long time and it's not infected now. The pt has been told by his PCP numerous times to have it removed. I asked Dr Michaell Cowing about removing the cyst and he said for the pt to show him the day of surgery. The pt understands.

## 2012-12-15 ENCOUNTER — Other Ambulatory Visit (HOSPITAL_COMMUNITY): Payer: Self-pay | Admitting: Surgery

## 2012-12-15 NOTE — Patient Instructions (Addendum)
20 Lee Christensen  12/15/2012   Your procedure is scheduled on: 12-29-2012  Report to [facility] at 530 AM.  Call this number if you have problems the morning of surgery [phone]   Remember:   Do not eat food or drink liquids :After Midnight.     Take these medicines the morning of surgery with A SIP OF WATER: prevacid                                SEE [facility] PREPARING FOR SURGERY SHEET   Do not wear jewelry, make-up or nail polish.  Do not wear lotions, powders, or perfumes. You may wear deodorant.   Men may shave face and neck.  Do not bring valuables to the hospital.  Contacts, dentures or bridgework may not be worn into surgery.  Leave suitcase in the car. After surgery it may be brought to your room.  For patients admitted to the hospital, checkout time is 11:00 AM the day of discharge.   Patients discharged the day of surgery will not be allowed to drive home.  Name and phone number of your driver:  Special Instructions: N/A   Please read over the following fact sheets that you were given: MRSA Information.  Call Cain Sieve RN pre op nurse if needed [phone]    FAILURE TO FOLLOW THESE INSTRUCTIONS MAY RESULT IN THE CANCELLATION OF YOUR SURGERY. PATIENT SIGNATURE___________________________________________

## 2012-12-18 ENCOUNTER — Ambulatory Visit (HOSPITAL_COMMUNITY)
Admission: RE | Admit: 2012-12-18 | Discharge: 2012-12-18 | Disposition: A | Discharge status: Home / Self Care | Payer: BC Managed Care – PPO | Source: Ambulatory Visit | Attending: Surgery | Admitting: Surgery

## 2012-12-18 ENCOUNTER — Encounter (HOSPITAL_COMMUNITY): Payer: Self-pay

## 2012-12-18 ENCOUNTER — Encounter (HOSPITAL_COMMUNITY)
Admission: RE | Admit: 2012-12-18 | Discharge: 2012-12-18 | Disposition: A | Discharge status: Home / Self Care | Payer: BC Managed Care – PPO | Source: Ambulatory Visit | Attending: Surgery | Admitting: Surgery

## 2012-12-18 DIAGNOSIS — Z01818 Encounter for other preprocedural examination: Secondary | ICD-10-CM | POA: Insufficient documentation

## 2012-12-18 DIAGNOSIS — I10 Essential (primary) hypertension: Secondary | ICD-10-CM | POA: Insufficient documentation

## 2012-12-18 DIAGNOSIS — K4090 Unilateral inguinal hernia, without obstruction or gangrene, not specified as recurrent: Secondary | ICD-10-CM | POA: Insufficient documentation

## 2012-12-18 DIAGNOSIS — Z01812 Encounter for preprocedural laboratory examination: Secondary | ICD-10-CM | POA: Insufficient documentation

## 2012-12-18 DIAGNOSIS — K429 Umbilical hernia without obstruction or gangrene: Secondary | ICD-10-CM | POA: Insufficient documentation

## 2012-12-18 DIAGNOSIS — Z01811 Encounter for preprocedural respiratory examination: Secondary | ICD-10-CM | POA: Insufficient documentation

## 2012-12-18 DIAGNOSIS — Z87891 Personal history of nicotine dependence: Secondary | ICD-10-CM | POA: Insufficient documentation

## 2012-12-18 HISTORY — DX: Anemia, unspecified: D64.9

## 2012-12-18 HISTORY — DX: Other complications of anesthesia, initial encounter: T88.59XA

## 2012-12-18 HISTORY — DX: Diverticulosis of intestine, part unspecified, without perforation or abscess without bleeding: K57.90

## 2012-12-18 HISTORY — DX: Adverse effect of unspecified anesthetic, initial encounter: T41.45XA

## 2012-12-18 LAB — CBC
HCT: 39 % (ref 39.0–52.0)
Hemoglobin: 12.8 g/dL — ABNORMAL LOW (ref 13.0–17.0)
MCH: 28.1 pg (ref 26.0–34.0)
MCHC: 32.8 g/dL (ref 30.0–36.0)
MCV: 85.7 fL (ref 78.0–100.0)
RDW: 17 % — ABNORMAL HIGH (ref 11.5–15.5)

## 2012-12-18 LAB — BASIC METABOLIC PANEL
BUN: 21 mg/dL (ref 6–23)
Calcium: 9.9 mg/dL (ref 8.4–10.5)
Creatinine, Ser: 0.92 mg/dL (ref 0.50–1.35)
GFR calc Af Amer: >90 mL/min (ref >90)
GFR calc non Af Amer: 89 mL/min — ABNORMAL LOW (ref >90)
Glucose, Bld: 122 mg/dL — ABNORMAL HIGH (ref 70–99)
Potassium: 4.6 mEq/L (ref 3.5–5.1)

## 2012-12-18 NOTE — Telephone Encounter (Signed)
We will try to add on to his consent orders on the day of surgery.

## 2012-12-21 LAB — MRSA CULTURE

## 2012-12-28 MED ORDER — GENTAMICIN SULFATE 40 MG/ML IJ SOLN
360.0000 mg | INTRAVENOUS | Status: AC
  Administered 2012-12-29: 360 mg via INTRAVENOUS
  Filled 2012-12-28: qty 9

## 2012-12-29 ENCOUNTER — Encounter (HOSPITAL_COMMUNITY): Payer: Self-pay | Admitting: *Deleted

## 2012-12-29 ENCOUNTER — Encounter (HOSPITAL_COMMUNITY): Payer: Self-pay | Admitting: Certified Registered Nurse Anesthetist

## 2012-12-29 ENCOUNTER — Ambulatory Visit (HOSPITAL_COMMUNITY)
Admission: RE | Admit: 2012-12-29 | Discharge: 2012-12-29 | Disposition: A | Discharge status: Home / Self Care | Payer: BC Managed Care – PPO | Source: Ambulatory Visit | Attending: Surgery | Admitting: Surgery

## 2012-12-29 ENCOUNTER — Encounter (HOSPITAL_COMMUNITY)
Admission: RE | Disposition: A | Discharge status: Home / Self Care | Payer: Self-pay | Source: Ambulatory Visit | Attending: Surgery

## 2012-12-29 ENCOUNTER — Ambulatory Visit (HOSPITAL_COMMUNITY): Payer: BC Managed Care – PPO | Admitting: Certified Registered Nurse Anesthetist

## 2012-12-29 DIAGNOSIS — R222 Localized swelling, mass and lump, trunk: Secondary | ICD-10-CM | POA: Insufficient documentation

## 2012-12-29 DIAGNOSIS — D213 Benign neoplasm of connective and other soft tissue of thorax: Secondary | ICD-10-CM

## 2012-12-29 DIAGNOSIS — D1779 Benign lipomatous neoplasm of other sites: Secondary | ICD-10-CM | POA: Insufficient documentation

## 2012-12-29 DIAGNOSIS — K4020 Bilateral inguinal hernia, without obstruction or gangrene, not specified as recurrent: Secondary | ICD-10-CM | POA: Insufficient documentation

## 2012-12-29 DIAGNOSIS — K429 Umbilical hernia without obstruction or gangrene: Secondary | ICD-10-CM

## 2012-12-29 DIAGNOSIS — K4090 Unilateral inguinal hernia, without obstruction or gangrene, not specified as recurrent: Secondary | ICD-10-CM

## 2012-12-29 DIAGNOSIS — S39013D Strain of muscle, fascia and tendon of pelvis, subsequent encounter: Secondary | ICD-10-CM

## 2012-12-29 DIAGNOSIS — I10 Essential (primary) hypertension: Secondary | ICD-10-CM | POA: Insufficient documentation

## 2012-12-29 DIAGNOSIS — K219 Gastro-esophageal reflux disease without esophagitis: Secondary | ICD-10-CM | POA: Insufficient documentation

## 2012-12-29 DIAGNOSIS — Z79899 Other long term (current) drug therapy: Secondary | ICD-10-CM | POA: Insufficient documentation

## 2012-12-29 DIAGNOSIS — D1809 Hemangioma of other sites: Secondary | ICD-10-CM | POA: Insufficient documentation

## 2012-12-29 DIAGNOSIS — E119 Type 2 diabetes mellitus without complications: Secondary | ICD-10-CM | POA: Insufficient documentation

## 2012-12-29 HISTORY — PX: LAPAROSCOPIC INGUINAL HERNIA WITH UMBILICAL HERNIA: SHX5658

## 2012-12-29 HISTORY — PX: MASS EXCISION: SHX2000

## 2012-12-29 HISTORY — PX: INSERTION OF MESH: SHX5868

## 2012-12-29 LAB — GLUCOSE, CAPILLARY: Glucose-Capillary: 103 mg/dL — ABNORMAL HIGH (ref 70–99)

## 2012-12-29 SURGERY — LAPAROSCOPIC INGUINAL HERNIA WITH UMBILICAL HERNIA
Anesthesia: General | Site: Groin | Laterality: Left | Wound class: Clean

## 2012-12-29 MED ORDER — NAPROXEN 500 MG PO TABS: 500.0000 mg | ORAL_TABLET | Freq: Two times a day (BID) | ORAL | Status: DC

## 2012-12-29 MED ORDER — SODIUM CHLORIDE 0.9 % IJ SOLN: 3.0000 mL | Freq: Two times a day (BID) | INTRAMUSCULAR | Status: DC

## 2012-12-29 MED ORDER — SODIUM CHLORIDE 0.9 % IJ SOLN: 3.0000 mL | INTRAMUSCULAR | Status: DC | PRN

## 2012-12-29 MED ORDER — IRBESARTAN 150 MG PO TABS
150.0000 mg | ORAL_TABLET | Freq: Every morning | ORAL | Status: DC
  Filled 2012-12-29: qty 1

## 2012-12-29 MED ORDER — FENTANYL CITRATE 0.05 MG/ML IJ SOLN
INTRAMUSCULAR | Status: DC | PRN
  Administered 2012-12-29 (×4): 50 ug via INTRAVENOUS

## 2012-12-29 MED ORDER — CLINDAMYCIN PHOSPHATE 900 MG/50ML IV SOLN
900.0000 mg | INTRAVENOUS | Status: AC
  Administered 2012-12-29: 900 mg via INTRAVENOUS

## 2012-12-29 MED ORDER — LACTATED RINGERS IR SOLN
Status: DC | PRN
  Administered 2012-12-29: 1000 mL

## 2012-12-29 MED ORDER — ACETAMINOPHEN 325 MG PO TABS: 650.0000 mg | ORAL_TABLET | ORAL | Status: DC | PRN

## 2012-12-29 MED ORDER — FENTANYL CITRATE 0.05 MG/ML IJ SOLN: 25.0000 ug | INTRAMUSCULAR | Status: DC | PRN

## 2012-12-29 MED ORDER — OXYCODONE HCL 5 MG PO TABS
ORAL_TABLET | ORAL | Status: AC
  Administered 2012-12-29: 5 mg via ORAL
  Filled 2012-12-29: qty 1

## 2012-12-29 MED ORDER — LIDOCAINE HCL (CARDIAC) 20 MG/ML IV SOLN
INTRAVENOUS | Status: DC | PRN
  Administered 2012-12-29: 100 mg via INTRAVENOUS

## 2012-12-29 MED ORDER — HYDROMORPHONE HCL PF 1 MG/ML IJ SOLN
0.2500 mg | INTRAMUSCULAR | Status: DC | PRN
  Administered 2012-12-29 (×2): 0.5 mg via INTRAVENOUS

## 2012-12-29 MED ORDER — KETOROLAC TROMETHAMINE 30 MG/ML IJ SOLN
INTRAMUSCULAR | Status: DC | PRN
  Administered 2012-12-29: 30 mg via INTRAVENOUS

## 2012-12-29 MED ORDER — CLINDAMYCIN PHOSPHATE 900 MG/50ML IV SOLN
INTRAVENOUS | Status: AC
  Filled 2012-12-29: qty 50

## 2012-12-29 MED ORDER — ONDANSETRON HCL 4 MG/2ML IJ SOLN: 4.0000 mg | Freq: Four times a day (QID) | INTRAMUSCULAR | Status: DC | PRN

## 2012-12-29 MED ORDER — PHENYLEPHRINE HCL 10 MG/ML IJ SOLN
INTRAMUSCULAR | Status: DC | PRN
  Administered 2012-12-29: 80 ug via INTRAVENOUS

## 2012-12-29 MED ORDER — OXYCODONE HCL 5 MG PO TABS: 5.0000 mg | ORAL_TABLET | ORAL | Status: DC | PRN

## 2012-12-29 MED ORDER — SODIUM CHLORIDE 0.9 % IV SOLN: 250.0000 mL | INTRAVENOUS | Status: DC | PRN

## 2012-12-29 MED ORDER — DEXAMETHASONE SODIUM PHOSPHATE 10 MG/ML IJ SOLN
INTRAMUSCULAR | Status: DC | PRN
  Administered 2012-12-29: 10 mg via INTRAVENOUS

## 2012-12-29 MED ORDER — BUPIVACAINE-EPINEPHRINE 0.25% -1:200000 IJ SOLN
INTRAMUSCULAR | Status: AC
  Filled 2012-12-29: qty 1

## 2012-12-29 MED ORDER — BUPIVACAINE-EPINEPHRINE 0.25% -1:200000 IJ SOLN
INTRAMUSCULAR | Status: DC | PRN
  Administered 2012-12-29: 70 mL

## 2012-12-29 MED ORDER — PROMETHAZINE HCL 25 MG/ML IJ SOLN
6.2500 mg | INTRAMUSCULAR | Status: DC | PRN
  Administered 2012-12-29: 12:00:00 via INTRAVENOUS
  Filled 2012-12-29: qty 1

## 2012-12-29 MED ORDER — SUCCINYLCHOLINE CHLORIDE 20 MG/ML IJ SOLN
INTRAMUSCULAR | Status: DC | PRN
  Administered 2012-12-29: 100 mg via INTRAVENOUS

## 2012-12-29 MED ORDER — ROCURONIUM BROMIDE 100 MG/10ML IV SOLN
INTRAVENOUS | Status: DC | PRN
  Administered 2012-12-29: 10 mg via INTRAVENOUS
  Administered 2012-12-29: 5 mg via INTRAVENOUS
  Administered 2012-12-29: 40 mg via INTRAVENOUS

## 2012-12-29 MED ORDER — ACETAMINOPHEN 10 MG/ML IV SOLN
INTRAVENOUS | Status: AC
  Filled 2012-12-29: qty 100

## 2012-12-29 MED ORDER — ONDANSETRON HCL 4 MG/2ML IJ SOLN
INTRAMUSCULAR | Status: DC | PRN
  Administered 2012-12-29: 4 mg via INTRAVENOUS

## 2012-12-29 MED ORDER — ACETAMINOPHEN 10 MG/ML IV SOLN
INTRAVENOUS | Status: DC | PRN
  Administered 2012-12-29: 1000 mg via INTRAVENOUS

## 2012-12-29 MED ORDER — BUPIVACAINE-EPINEPHRINE PF 0.25-1:200000 % IJ SOLN
INTRAMUSCULAR | Status: AC
  Filled 2012-12-29: qty 30

## 2012-12-29 MED ORDER — PROPOFOL 10 MG/ML IV BOLUS
INTRAVENOUS | Status: DC | PRN
  Administered 2012-12-29: 150 mg via INTRAVENOUS

## 2012-12-29 MED ORDER — PANTOPRAZOLE SODIUM 40 MG PO TBEC
40.0000 mg | DELAYED_RELEASE_TABLET | Freq: Every day | ORAL | Status: DC
  Administered 2012-12-29: 40 mg via ORAL
  Filled 2012-12-29 (×2): qty 1

## 2012-12-29 MED ORDER — LACTATED RINGERS IV SOLN
INTRAVENOUS | Status: DC | PRN
  Administered 2012-12-29: 07:00:00 via INTRAVENOUS
  Administered 2012-12-29: 1000 mL

## 2012-12-29 MED ORDER — HYDROMORPHONE HCL PF 1 MG/ML IJ SOLN
INTRAMUSCULAR | Status: AC
  Filled 2012-12-29: qty 1

## 2012-12-29 MED ORDER — GLYCOPYRROLATE 0.2 MG/ML IJ SOLN
INTRAMUSCULAR | Status: DC | PRN
  Administered 2012-12-29: 0.6 mg via INTRAVENOUS

## 2012-12-29 MED ORDER — ACETAMINOPHEN 650 MG RE SUPP
650.0000 mg | RECTAL | Status: DC | PRN
  Filled 2012-12-29: qty 1

## 2012-12-29 MED ORDER — NEOSTIGMINE METHYLSULFATE 1 MG/ML IJ SOLN
INTRAMUSCULAR | Status: DC | PRN
  Administered 2012-12-29: 5 mg via INTRAVENOUS

## 2012-12-29 MED ORDER — MIDAZOLAM HCL 5 MG/5ML IJ SOLN
INTRAMUSCULAR | Status: DC | PRN
  Administered 2012-12-29: 2 mg via INTRAVENOUS

## 2012-12-29 MED ORDER — 0.9 % SODIUM CHLORIDE (POUR BTL) OPTIME
TOPICAL | Status: DC | PRN
  Administered 2012-12-29: 1000 mL

## 2012-12-29 SURGICAL SUPPLY — 41 items
CANISTER SUCTION 2500CC (MISCELLANEOUS) ×4 IMPLANT
CHLORAPREP W/TINT 26ML (MISCELLANEOUS) ×4 IMPLANT
CLOTH BEACON ORANGE TIMEOUT ST (SAFETY) ×4 IMPLANT
DECANTER SPIKE VIAL GLASS SM (MISCELLANEOUS) ×4 IMPLANT
DISSECTOR BLUNT TIP ENDO 5MM (MISCELLANEOUS) IMPLANT
DRAPE LAPAROSCOPIC ABDOMINAL (DRAPES) ×4 IMPLANT
DRAPE WARM FLUID 44X44 (DRAPE) ×4 IMPLANT
DRSG TEGADERM 2-3/8X2-3/4 SM (GAUZE/BANDAGES/DRESSINGS) ×8 IMPLANT
DRSG TEGADERM 4X4.75 (GAUZE/BANDAGES/DRESSINGS) ×5 IMPLANT
ELECT REM PT RETURN 9FT ADLT (ELECTROSURGICAL) ×4
ELECTRODE REM PT RTRN 9FT ADLT (ELECTROSURGICAL) ×3 IMPLANT
GAUZE SPONGE 2X2 8PLY STRL LF (GAUZE/BANDAGES/DRESSINGS) IMPLANT
GLOVE BIOGEL PI IND STRL 7.0 (GLOVE) ×3 IMPLANT
GLOVE BIOGEL PI INDICATOR 7.0 (GLOVE) ×1
GLOVE ECLIPSE 8.0 STRL XLNG CF (GLOVE) ×4 IMPLANT
GLOVE INDICATOR 8.0 STRL GRN (GLOVE) ×8 IMPLANT
GOWN STRL NON-REIN LRG LVL3 (GOWN DISPOSABLE) ×3 IMPLANT
GOWN STRL REIN XL XLG (GOWN DISPOSABLE) ×10 IMPLANT
KIT BASIN OR (CUSTOM PROCEDURE TRAY) ×4 IMPLANT
MARKER SKIN DUAL TIP RULER LAB (MISCELLANEOUS) ×3 IMPLANT
MESH ULTRAPRO 6X6 15CM15CM (Mesh General) ×2 IMPLANT
NDL INSUFFLATION 14GA 120MM (NEEDLE) IMPLANT
NEEDLE INSUFFLATION 14GA 120MM (NEEDLE) IMPLANT
PENCIL BUTTON HOLSTER BLD 10FT (ELECTRODE) ×1 IMPLANT
SET IRRIG TUBING LAPAROSCOPIC (IRRIGATION / IRRIGATOR) ×1 IMPLANT
SLEEVE Z-THREAD 5X100MM (TROCAR) ×3 IMPLANT
SPONGE GAUZE 2X2 STER 10/PKG (GAUZE/BANDAGES/DRESSINGS) ×1
STRIP CLOSURE SKIN 1/2X4 (GAUZE/BANDAGES/DRESSINGS) ×1 IMPLANT
SUT MNCRL AB 4-0 PS2 18 (SUTURE) ×5 IMPLANT
SUT PDS AB 0 CT1 36 (SUTURE) ×1 IMPLANT
SUT VIC AB 3-0 SH 8-18 (SUTURE) ×1 IMPLANT
TACKER 5MM HERNIA 3.5CML NAB (ENDOMECHANICALS) IMPLANT
TOWEL OR 17X26 10 PK STRL BLUE (TOWEL DISPOSABLE) ×4 IMPLANT
TOWEL OR NON WOVEN STRL DISP B (DISPOSABLE) ×1 IMPLANT
TRAY LAP CHOLE (CUSTOM PROCEDURE TRAY) ×4 IMPLANT
TROCAR SLEEVE XCEL 5X75 (ENDOMECHANICALS) ×1 IMPLANT
TROCAR XCEL BLADELESS 5X75MML (TROCAR) ×1 IMPLANT
TROCAR XCEL BLUNT TIP 100MML (ENDOMECHANICALS) ×4 IMPLANT
TROCAR Z-THREAD FIOS 5X100MM (TROCAR) ×3 IMPLANT
TUBING FILTER THERMOFLATOR (ELECTROSURGICAL) ×1 IMPLANT
TUBING INSUFFLATION 10FT LAP (TUBING) ×3 IMPLANT

## 2012-12-29 NOTE — Interval H&P Note (Signed)
History and Physical Interval Note:  12/29/2012 7:24 AM  Holt A Odom  has presented today for surgery, with the diagnosis of bilateral inguinal hernia and umbilical hernia   The various methods of treatment have been discussed with the patient and family. After consideration of risks, benefits and other options for treatment, the patient has consented to  Procedure(s): LAPAROSCOPIC INGUINAL HERNIA WITH UMBILICAL HERNIA (Bilateral) INSERTION OF MESH (Bilateral) as a surgical intervention .  The patient also wants a SQ mass removed off his chest.  The patient's history has been reviewed, patient examined, no change in status, stable for surgery.  I have reviewed the patient's chart and labs.  Questions were answered to the patient's satisfaction.    The pathophysiology of skin & subcutaneous masses was discussed.  Natural history risks without surgery were discussed.  I recommended surgery to remove the mass.  I explained the technique of removal with use of local anesthesia & possible need for more aggressive sedation/anesthesia for patient comfort.    Risks such as bleeding, infection, heart attack, death, and other risks were discussed.  I noted a good likelihood this will help address the problem.   Possibility that this will not correct all symptoms was explained. Possibility of regrowth/recurrence of the mass was discussed.  We will work to minimize complications. Questions were answered.  The patient expresses understanding & wishes to proceed with surgery.   The anatomy & physiology of the abdominal wall and pelvic floor was discussed.  The pathophysiology of hernias in the inguinal and pelvic region was discussed.  Natural history risks such as progressive enlargement, pain, incarceration & strangulation was discussed.   Contributors to complications such as smoking, obesity, diabetes, prior surgery, etc were discussed.    I feel the risks of no intervention will lead to serious problems that  outweigh the operative risks; therefore, I recommended surgery to reduce and repair the hernia.  I explained laparoscopic techniques with possible need for an open approach.  I noted usual use of mesh to patch and/or buttress hernia repair  Risks such as bleeding, infection, abscess, need for further treatment, heart attack, death, and other risks were discussed.  I noted a good likelihood this will help address the problem.   Goals of post-operative recovery were discussed as well.  Possibility that this will not correct all symptoms was explained.  I stressed the importance of low-impact activity, aggressive pain control, avoiding constipation, & not pushing through pain to minimize risk of post-operative chronic pain or injury. Possibility of reherniation was discussed.  We will work to minimize complications.     An educational handout further explaining the pathology & treatment options was given as well.  Questions were answered.  The patient expresses understanding & wishes to proceed with surgery.     Aspynn Clover C.

## 2012-12-29 NOTE — Anesthesia Preprocedure Evaluation (Addendum)
Anesthesia Evaluation  Patient identified by MRN, date of birth, ID band Patient awake  General Assessment Comment:H/O slow to emerge from anesthesia.  Reviewed: Allergy & Precautions, H&P , NPO status , Patient's Chart, lab work & pertinent test results, reviewed documented beta blocker date and time   Airway Mallampati: II TM Distance: >3 FB Neck ROM: full    Dental  (+) Dental Advisory Given Discolored left upper incisor. Mildly loose front lower incisor.:   Pulmonary former smoker,  CXR 12-18-12 No acute disease.  90 pack year h/o smoking. Quit 1998 breath sounds clear to auscultation  Pulmonary exam normal       Cardiovascular Exercise Tolerance: Good hypertension, On Medications Rhythm:regular Rate:Normal     Neuro/Psych Chronic back pain.  Neuromuscular disease negative psych ROS   GI/Hepatic Neg liver ROS, GERD-  Medicated,  Endo/Other  diabetes, Type 2  Renal/GU negative Renal ROS  negative genitourinary   Musculoskeletal   Abdominal   Peds  Hematology negative hematology ROS (+) anemia ,   Anesthesia Other Findings   Reproductive/Obstetrics negative OB ROS                         Anesthesia Physical Anesthesia Plan  ASA: II  Anesthesia Plan: General ETT   Post-op Pain Management:    Induction:   Airway Management Planned:   Additional Equipment:   Intra-op Plan:   Post-operative Plan:   Informed Consent: I have reviewed the patients History and Physical, chart, labs and discussed the procedure including the risks, benefits and alternatives for the proposed anesthesia with the patient or authorized representative who has indicated his/her understanding and acceptance.   Dental Advisory Given  Plan Discussed with: CRNA  Anesthesia Plan Comments:         Anesthesia Quick Evaluation

## 2012-12-29 NOTE — Interval H&P Note (Signed)
History and Physical Interval Note:  12/29/2012 7:17 AM  Lee Christensen  has presented today for surgery, with the diagnosis of bilateral inguinal hernia and umbilical hernia   The various methods of treatment have been discussed with the patient and family. After consideration of risks, benefits and other options for treatment, the patient has consented to  Procedure(s): LAPAROSCOPIC INGUINAL HERNIA WITH UMBILICAL HERNIA (Bilateral) INSERTION OF MESH (Bilateral) as a surgical intervention .  The patient's history has been reviewed, patient examined, no change in status, stable for surgery.  I have reviewed the patient's chart and labs.  Questions were answered to the patient's satisfaction.     [name] C.

## 2012-12-29 NOTE — H&P (View-Only) (Signed)
Subjective:     Patient ID: Lee Christensen, male   DOB: 08/12/1951, 62 y.o.   MRN: [id number]  HPI  Lee Christensen  09/18/1950 [id number]  Patient Care Team: Lawrence Fusco, MD as PCP - General (Internal Medicine) Robert M Rourk, MD as Consulting Physician (Gastroenterology) Benjamin Mann, PA-C as Physician Assistant (Family Medicine) John C Golding, MD as Attending Physician (Family Medicine)  This patient is a 62 y.o.male who presents today for surgical evaluation at the request of Ben Mann / Dr. Golding.   Reason for visit: Right groin strain.  Question of hernia.  Umbilical and left inguinal hernias found on CAT scan.  Pleasant active male.  Recently retired.  Had a prior (inguinal hernia repair with mesh in the late 1990s.  He cannot recall the surgeon.  Noted a sharp lower groin pain in February.  Saw his primary care physician.  CT scan done.  Left kidney stone 3mm at the hilum.  Small left inguinal and umbilical hernias.  No right inguinal hernia seen.  The pain is gone down to more of an intermittent state.  It is worse when he coughs or when he is lifting or active.  He was sent to me for evaluation to see if he had hernia problems requiring surgery.  He used to walk a few miles a day.  However, his left knee has more severe us to arthritis and cannot walk more than a mile now.  Has a few bowel movements in the morning but otherwise okay.  Diverticulosis on recent colonoscopy.  No abdominal surgeries aside from a right inguinal hernia repair.  Patient Active Problem List  Diagnosis  . Encounter for screening colonoscopy  . RLQ abdominal pain  . Diverticulosis of sigmoid colon    Past Medical History  Diagnosis Date  . Hypertension   . Arthritis   . GERD (gastroesophageal reflux disease)   . Chronic back pain   . Diabetes mellitus without complication     Borderline.  . Diverticulitis   . Knee pain     Past Surgical History  Procedure Laterality Date  . Knee surgery     left x 2  . Hernia repair      ? lower abd  . Wisdom tooth extraction    . Esophagogastroduodenoscopy  06/25/03    RMR:salmon colored tongue-NO BARRETTs (EROSIVE ESOPHAGITIS)/small HH  . Colonoscopy  04/03/01    RMR:internal hemorrhoids otherwise normal  . Colonoscopy N/A 11/09/2012    Procedure: COLONOSCOPY;  Surgeon: Robert M Rourk, MD;  Location: [facility];  Service: Endoscopy;  Laterality: N/A;  10:30  . Cataract extraction w/ intraocular lens  implant, bilateral  1996    History   Social History  . Marital Status: Married    Spouse Name: N/A    Number of Children: 2  . Years of Education: N/A   Occupational History  . retired; hardwood flooring    Social History Main Topics  . Smoking status: Former Smoker    Types: Cigarettes    Quit date: 08/16/1996  . Smokeless tobacco: Former User    Types: Chew    Quit date: 11/01/1994  . Alcohol Use: 0.6 oz/week    1 Cans of beer per week     Comment: Rarely.  . Drug Use: No  . Sexually Active: Not on file   Other Topics Concern  . Not on file   Social History Narrative   Lives w/ wife    Family History    Problem Relation Age of Onset  . Prostate cancer Father   . Diabetes Father   . Hypertension Father   . Stroke Father   . Lung cancer Mother     Current Outpatient Prescriptions  Medication Sig Dispense Refill  . acetaminophen (TYLENOL) 500 MG tablet Take 500 mg by mouth every 6 (six) hours as needed for pain.      . hydrochlorothiazide (HYDRODIURIL) 25 MG tablet Take 25 mg by mouth daily.       . irbesartan (AVAPRO) 150 MG tablet Take 150 mg by mouth daily.       . iron polysaccharides (NIFEREX) 150 MG capsule Take 150 mg by mouth 2 (two) times daily.      . lansoprazole (PREVACID) 30 MG capsule Take 30 mg by mouth daily.      . lidocaine (LIDODERM) 5 % Place 1 patch onto the skin daily as needed (Pain).       . psyllium (METAMUCIL SMOOTH TEXTURE) 28 % packet Take 1 packet by mouth daily.       No current  facility-administered medications for this visit.     Allergies  Allergen Reactions  . Penicillins Rash    BP 150/82  Pulse 83  Temp(Src) 98.1 F (36.7 C) (Temporal)  Resp 18  Ht 5' 6" (1.676 m)  Wt 157 lb (71.215 kg)  BMI 25.35 kg/m2  No results found.   Review of Systems  Constitutional: Negative for fever, chills and diaphoresis.  HENT: Negative for nosebleeds, sore throat, facial swelling, mouth sores, trouble swallowing and ear discharge.   Eyes: Negative for photophobia, discharge and visual disturbance.  Respiratory: Negative for choking, chest tightness, shortness of breath and stridor.   Cardiovascular: Negative for chest pain and palpitations.  Gastrointestinal: Negative for nausea, vomiting, abdominal pain, diarrhea, constipation, blood in stool, abdominal distention, anal bleeding and rectal pain.  Genitourinary: Negative for dysuria, urgency, difficulty urinating and testicular pain.  Musculoskeletal: Negative for myalgias, back pain, arthralgias and gait problem.  Skin: Negative for color change, pallor, rash and wound.  Neurological: Negative for dizziness, speech difficulty, weakness, numbness and headaches.  Hematological: Negative for adenopathy. Does not bruise/bleed easily.  Psychiatric/Behavioral: Negative for hallucinations, confusion and agitation.       Objective:   Physical Exam  Constitutional: He is oriented to person, place, and time. He appears well-developed and well-nourished. No distress.  HENT:  Head: Normocephalic.  Mouth/Throat: Oropharynx is clear and moist. No oropharyngeal exudate.  Eyes: Conjunctivae and EOM are normal. Pupils are equal, round, and reactive to light. No scleral icterus.  Neck: Normal range of motion. Neck supple. No tracheal deviation present.  Cardiovascular: Normal rate, regular rhythm and intact distal pulses.   Pulmonary/Chest: Effort normal and breath sounds normal. No respiratory distress.  Abdominal: Soft.  Bowel sounds are normal. He exhibits no distension and no abdominal bruit. There is no rigidity, no guarding, no CVA tenderness, no tenderness at McBurney's point and negative Murphy's sign. A hernia is present. Hernia confirmed positive in the ventral area and confirmed positive in the left inguinal area. Hernia confirmed negative in the right inguinal area.    Musculoskeletal: Normal range of motion. He exhibits no tenderness.  Lymphadenopathy:    He has no cervical adenopathy.       Right: No inguinal adenopathy present.       Left: No inguinal adenopathy present.  Neurological: He is alert and oriented to person, place, and time. No cranial nerve deficit. He exhibits normal   muscle tone. Coordination normal.  Skin: Skin is warm and dry. No rash noted. He is not diaphoretic. No erythema. No pallor.  Psychiatric: He has a normal mood and affect. His behavior is normal. Judgment and thought content normal.       Assessment:     Right groin strain and pain.  Most consistent with musculoskeletal strain of old scar.  No strong evidence of hernia.  Small but definite left inguinal hernia.  Small umbilical hernia.     Plan:     At this point, I think his right groin strain is due to a pull.  Recommended heat and anti-inflammatories (naproxen two by mouth twice a day).  Do that for three weeks.  That should help musculoskeletal discomfort calm down.  It could be a subtle recurrent RIH hernia, but exam and CT scan not definitive.  The most aggressive option would be to do diagnostic laparoscopy at repair RIH if seen.  However, because the pain is getting less, I think it is reasonable to follow with anti-inflammatory regimen and see if it improves.  No strong trigger point that would benefit from a nerve block at this time.  At some point, his left inguinal hernia will need to be repaired.  I did offer diagnostic laparoscopy with repair of hernias.  I could do a primary repair of the iumbilical  hernia as well.  He seemed inclined to maybe do it now vs. waiting until the fall.  He wanted to leave the summer opened be active.  I did discuss procedure with him:  The anatomy & physiology of the abdominal wall and pelvic floor was discussed.  The pathophysiology of hernias in the inguinal and pelvic region was discussed.  Natural history risks such as progressive enlargement, pain, incarceration & strangulation was discussed.   Contributors to complications such as smoking, obesity, diabetes, prior surgery, etc were discussed.    I feel the risks of no intervention will lead to serious problems that outweigh the operative risks; therefore, I recommended surgery to reduce and repair the hernia.  I explained laparoscopic techniques with possible need for an open approach.  I noted usual use of mesh to patch and/or buttress hernia repair  Risks such as bleeding, infection, abscess, need for further treatment, heart attack, death, and other risks were discussed.  I noted a good likelihood this will help address the problem.   Goals of post-operative recovery were discussed as well.  Possibility that this will not correct all symptoms was explained.  I stressed the importance of low-impact activity, aggressive pain control, avoiding constipation, & not pushing through pain to minimize risk of post-operative chronic pain or injury. Possibility of reherniation was discussed.  We will work to minimize complications.     An educational handout further explaining the pathology & treatment options was given as well.  Questions were answered.  The patient expresses understanding & wishes to think about things before planning surgery.        

## 2012-12-29 NOTE — Progress Notes (Signed)
Ambulated length of hallwlay x 2 unsuccessful attempt at voiding  Up in chair at present taking fluids.  Will continue to monitor

## 2012-12-29 NOTE — Anesthesia Postprocedure Evaluation (Signed)
Anesthesia Post Note  Patient: Lee Christensen  Procedure(s) Performed: Procedure(s) (LRB): LAPAROSCOPIC INGUINAL HERNIA WITH UMBILICAL HERNIA (Bilateral) INSERTION OF MESH (Bilateral) EXCISION MASS LEFT CHEST WALL (Left)  Anesthesia type: General  Patient location: PACU  Post pain: Pain level controlled  Post assessment: Post-op Vital signs reviewed  Last Vitals:  Filed Vitals:   12/29/12 1028  BP: 147/83  Pulse: 68  Temp: 36.9 C  Resp: 17    Post vital signs: Reviewed  Level of consciousness: sedated  Complications: No apparent anesthesia complications

## 2012-12-29 NOTE — Transfer of Care (Signed)
Immediate Anesthesia Transfer of Care Note  Patient: Lee Christensen  Procedure(s) Performed: Procedure(s) (LRB): LAPAROSCOPIC INGUINAL HERNIA WITH UMBILICAL HERNIA (Bilateral) INSERTION OF MESH (Bilateral) EXCISION MASS LEFT CHEST WALL (Left)  Patient Location: PACU  Anesthesia Type: General  Level of Consciousness: sedated, patient cooperative and responds to stimulaton  Airway & Oxygen Therapy: Patient Spontanous Breathing and Patient connected to face mask oxgen  Post-op Assessment: Report given to PACU RN and Post -op Vital signs reviewed and stable  Post vital signs: Reviewed and stable  Complications: No apparent anesthesia complications

## 2012-12-29 NOTE — Preoperative (Signed)
Beta Blockers   Reason not to administer Beta Blockers:Not Applicable 

## 2012-12-29 NOTE — Progress Notes (Signed)
Patient is unable to urinate. Bladder scanned for 200 cc. He is up ambulating in the hallway. Has order to in and out cath if needed. Emotional support offered.

## 2012-12-29 NOTE — Op Note (Signed)
12/29/2012  9:29 AM  PATIENT:  Lee Christensen  61 y.o. male  Patient Care Team: Elfredia Nevins, MD as PCP - General (Internal Medicine) Corbin Ade, MD as Consulting Physician (Gastroenterology) Lenise Herald, PA-C as Physician Assistant (Family Medicine) Corrie Mckusick, MD as Attending Physician (Family Medicine)  PRE-OPERATIVE DIAGNOSIS:  bilateral inguinal hernia and umbilical hernia and left chest wall mass  POST-OPERATIVE DIAGNOSIS:  bilateral inguinal hernia (recurrent right) and umbilical hernia and left chest wall mass  PROCEDURE:  Procedure(s): LAPAROSCOPIC BILATERAL INGUINAL HERNIA WITH UMBILICAL HERNIA INSERTION OF MESH EXCISION MASS LEFT CHEST WALL  SURGEON:  Surgeon(s): Ardeth Sportsman, MD  ASSISTANT: RN   ANESTHESIA:   local and general  EBL:     Delay start of Pharmacological VTE agent (>24hrs) due to surgical blood loss or risk of bleeding:  no  DRAINS: none   SPECIMEN:  Source of Specimen:  Lefta anterior chest wall (SQ 4x3x3cm)  DISPOSITION OF SPECIMEN:  PATHOLOGY  COUNTS:  YES  PLAN OF CARE: Discharge to home after PACU  PATIENT DISPOSITION:  PACU - hemodynamically stable.  INDICATION: Pleasant male status post open repair of retinal hernia a few years ago with mesh.  Developed some right groin pain.  Is not improved with decreased anterior activity and anti-inflammatories.  Concern of recurrent hernia.  I felt a small hernia at the umbilicus and left inguinal region as well.  I recommended diagnostic laparoscopy with repair of the diagnosed hernias:  The anatomy & physiology of the abdominal wall and pelvic floor was discussed.  The pathophysiology of hernias in the inguinal and pelvic region was discussed.  Natural history risks such as progressive enlargement, pain, incarceration & strangulation was discussed.   Contributors to complications such as smoking, obesity, diabetes, prior surgery, etc were discussed.    I feel the risks of no  intervention will lead to serious problems that outweigh the operative risks; therefore, I recommended surgery to reduce and repair the hernia.  I explained laparoscopic techniques with possible need for an open approach.  I noted usual use of mesh to patch and/or buttress hernia repair  Risks such as bleeding, infection, abscess, need for further treatment, heart attack, death, and other risks were discussed.  I noted a good likelihood this will help address the problem.   Goals of post-operative recovery were discussed as well.  Possibility that this will not correct all symptoms was explained.  I stressed the importance of low-impact activity, aggressive pain control, avoiding constipation, & not pushing through pain to minimize risk of post-operative chronic pain or injury. Possibility of reherniation was discussed.  We will work to minimize complications.     An educational handout further explaining the pathology & treatment options was given as well.  Questions were answered.  The patient expresses understanding & wishes to proceed with surgery.  OR FINDINGS: Small LIH indirect Small lateral RIH - subtle Small umb hernia - repaired primarily  Mass in the infraclavicular region.  Subcutaneous.  4 x 3 x 3 cm   DESCRIPTION:   The patient was identified & brought into the operating room. The patient was positioned supine with arms tucked. SCDs were active during the entire case. The patient underwent general anesthesia without any difficulty.  The abdomen was prepped and draped in a sterile fashion. The patient's bladder was emptied.  A Surgical Timeout confirmed our plan.  I focused off removal of the anterior chest wall mass.  I made a gentle curve  transverse incision over skin folds on the subcutaneous mass in the infraclavicular region of the lateral anterior chest wall.  Counter a purple vascular mass.  Some dense adherent to the dermis lined up removing an elliptical incision of skin with it  as well.  He was able to get it out the subcutaneous tissues.  It was not adherent to the pectoralis fascia.  I did have to turn the corners a little bit to avoid any dog earring.  I assured hemostasis.  I closed the wound using through Vicryl stitch and 4 Monocryl stitch and Steri-Strips.  Dressing applied  I turned attention to the hernia repairs.  I made a transverse incision through the inferior umbilical fold.  I made a small transverse nick through the anterior rectus fascia contralateral to the inguinal hernia side and placed a 0-vicryl stitch through the fascia.  I placed a Hasson trocar into the preperitoneal plane.  Entry was clean.  We induced carbon dioxide insufflation. Camera inspection revealed no injury.  I used a 10mm angled scope to bluntl free the peritoneum off the infraumbilical anterior abdominal wall.  I created enough of a preperitoneal pocket to place 5mm ports into the right & left mid-abdomen into this preperitoneal cavity.  I focused attention on the right side since that was the dominant complaint side.   I used blunt & focused sharp dissection to free the peritoneum off the flank and down to the pubic rim.  I freed the anteriolateral bladder wall off the anteriolateral pelvic wall, sparing midline attachments.   I freed peritoneum going close to a small lateral internal ring defect consistent with a small recurrent indirect hernia.  I gradually freed the peritoneal hernia sac off safely and reduced it into the preperitoneal space.  I freed the peritoneum off the spermatic vessels & vas deferens.  I freed peritoneum off the retroperitoneum along the psoas muscle.    I freed off some mildly dilated lymph nodes off the femoral ring and femoral canal.  Mild laxity there but no true hernia.  Make sure he had good nerve blocks of ilioinguinal genitofemoral and spermatic CORD nerve blocks.  Also did a field block over the prior open repair.  I checked & assured hemostasis.    I turned  attention on the opposite side.  I did dissection in a similar, mirror-image fashion. The patient had a small indirect hernia.    He had a moderate cluster of cord lipomas as well.  These were freed off.There were no breaches in peritoneum that required closure.  I chose 15x15 cm sheets of ultra-lightweight polypropylene mesh (Ultrapro), one for each side.  I cut a single sigmoid-shaped slit ~6cm from a corner of each mesh.  I placed the meshes into the preperitoneal space & laid them as overlapping diamonds such that at the inferior points, a 6x6 cm corner flap rested in the true anterolateral pelvis, covering the obturator & femoral foramina.   I allowed the bladder to fall back and help tuck the corners of the mesh in.  The medial corners overlapped each other across midline cephalad to the pubic rim.   This provided >2 inch coverage around the hernia.  Because the defects were well covered and not particularly large, I did not need tacks to hold the mesh in place.  I also put intact to avoid any possible nerve irritation.  I held the hernia sacs cephalad & evacuated carbon dioxide.  I closed the fascia  With absorbable suture.  I closed the skin using 4-0 monocryl stitch.  Sterile dressings were applied. The patient was extubated & arrived in the PACU in stable condition..  I had discussed postoperative care with the patient in the holding area.   I did discuss operative findings and postoperative goals / instructions to family as well.  Instructions are written in the chart.

## 2013-01-01 ENCOUNTER — Encounter (HOSPITAL_COMMUNITY): Payer: Self-pay | Admitting: Surgery

## 2013-01-02 ENCOUNTER — Telehealth (INDEPENDENT_AMBULATORY_CARE_PROVIDER_SITE_OTHER): Payer: Self-pay

## 2013-01-02 NOTE — Telephone Encounter (Signed)
Called pt to notify him of the path report to be benign per Dr Michaell Cowing. The path report was a benign hemangioma which is a cluster of blood vessels per Dr Michaell Cowing. I will give pt a copy of the path report at his next f/u appt with Dr Michaell Cowing. The pt understands.

## 2013-01-17 ENCOUNTER — Ambulatory Visit (INDEPENDENT_AMBULATORY_CARE_PROVIDER_SITE_OTHER): Payer: BC Managed Care – PPO | Admitting: Surgery

## 2013-01-17 ENCOUNTER — Encounter (INDEPENDENT_AMBULATORY_CARE_PROVIDER_SITE_OTHER): Payer: Self-pay | Admitting: Surgery

## 2013-01-17 VITALS — BP 142/80 | HR 76 | Temp 98.4°F | Resp 18 | Ht 66.0 in | Wt 157.4 lb

## 2013-01-17 DIAGNOSIS — K429 Umbilical hernia without obstruction or gangrene: Secondary | ICD-10-CM

## 2013-01-17 DIAGNOSIS — D1801 Hemangioma of skin and subcutaneous tissue: Secondary | ICD-10-CM

## 2013-01-17 DIAGNOSIS — K4090 Unilateral inguinal hernia, without obstruction or gangrene, not specified as recurrent: Secondary | ICD-10-CM

## 2013-01-17 HISTORY — DX: Hemangioma of skin and subcutaneous tissue: D18.01

## 2013-01-17 NOTE — Patient Instructions (Addendum)
HERNIA REPAIR: POST OP INSTRUCTIONS  1. DIET: Follow a light bland diet the first 24 hours after arrival home, such as soup, liquids, crackers, etc.  Be sure to include lots of fluids daily.  Avoid fast food or heavy meals as your are more likely to get nauseated.  Eat a low fat the next few days after surgery. 2. Take your usually prescribed home medications unless otherwise directed. 3. PAIN CONTROL: a. Pain is best controlled by a usual combination of three different methods TOGETHER: i. Ice/Heat ii. Over the counter pain medication iii. Prescription pain medication b. Most patients will experience some swelling and bruising around the hernia(s) such as the bellybutton, groins, or old incisions.  Ice packs or heating pads (30-60 minutes up to 6 times a day) will help. Use ice for the first few days to help decrease swelling and bruising, then switch to heat to help relax tight/sore spots and speed recovery.  Some people prefer to use ice alone, heat alone, alternating between ice & heat.  Experiment to what works for you.  Swelling and bruising can take several weeks to resolve.   c. It is helpful to take an over-the-counter pain medication regularly for the first few weeks.  Choose one of the following that works best for you: i. Naproxen (Aleve, etc)  Two 220mg tabs twice a day ii. Ibuprofen (Advil, etc) Three 200mg tabs four times a day (every meal & bedtime) iii. Acetaminophen (Tylenol, etc) 325-650mg four times a day (every meal & bedtime) d. A  prescription for pain medication should be given to you upon discharge.  Take your pain medication as prescribed.  i. If you are having problems/concerns with the prescription medicine (does not control pain, nausea, vomiting, rash, itching, etc), please call us [phone] to see if we need to switch you to a different pain medicine that will work better for you and/or control your side effect better. ii. If you need a refill on your pain  medication, please contact your pharmacy.  They will contact our office to request authorization. Prescriptions will not be filled after 5 pm or on week-ends. 4. Avoid getting constipated.  Between the surgery and the pain medications, it is common to experience some constipation.  Increasing fluid intake and taking a fiber supplement (such as Metamucil, Citrucel, FiberCon, MiraLax, etc) 1-2 times a day regularly will usually help prevent this problem from occurring.  A mild laxative (prune juice, Milk of Magnesia, MiraLax, etc) should be taken according to package directions if there are no bowel movements after 48 hours.   5. Wash / shower every day.  You may shower over the dressings as they are waterproof.   6. Remove your waterproof bandages 5 days after surgery.  You may leave the incision open to air.  You may replace a dressing/Band-Aid to cover the incision for comfort if you wish.  Continue to shower over incision(s) after the dressing is off.    7. ACTIVITIES as tolerated:   a. You may resume regular (light) daily activities beginning the next day-such as daily Tangredi-care, walking, climbing stairs-gradually increasing activities as tolerated.  If you can walk 30 minutes without difficulty, it is safe to try more intense activity such as jogging, treadmill, bicycling, low-impact aerobics, swimming, etc. b. Save the most intensive and strenuous activity for last such as sit-ups, heavy lifting, contact sports, etc  Refrain from any heavy lifting or straining until you are off narcotics for pain control.     c. DO NOT PUSH THROUGH PAIN.  Let pain be your guide: If it hurts to do something, don't do it.  Pain is your body warning you to avoid that activity for another week until the pain goes down. d. You may drive when you are no longer taking prescription pain medication, you can comfortably wear a seatbelt, and you can safely maneuver your car and apply brakes. e. Dennis Bast may have sexual intercourse  when it is comfortable.  8. FOLLOW UP in our office a. Please call [facility] at [phone] to set up an appointment to see your surgeon in the office for a follow-up appointment approximately 2-3 weeks after your surgery. b. Make sure that you call for this appointment the day you arrive home to insure a convenient appointment time. 9.  IF YOU HAVE DISABILITY OR FAMILY LEAVE FORMS, BRING THEM TO THE OFFICE FOR PROCESSING.  DO NOT GIVE THEM TO YOUR DOCTOR.  WHEN TO CALL us [phone]: 1. Poor pain control 2. Reactions / problems with new medications (rash/itching, nausea, etc)  3. Fever over 101.5 F (38.5 C) 4. Inability to urinate 5. Nausea and/or vomiting 6. Worsening swelling or bruising 7. Continued bleeding from incision. 8. Increased pain, redness, or drainage from the incision   The clinic staff is available to answer your questions during regular business hours (8:30am-5pm).  Please don't hesitate to call and ask to speak to one of our nurses for clinical concerns.   If you have a medical emergency, go to the nearest emergency room or call 911.  A surgeon from [facility] is always on call at the hospitals in [facility], [place], [place], [place], [place]  [address] ?  [address] [address], [place],    [address] MAIN: [id number] ? TOLL FREE: [phone] ? FAX: [phone] [email or web address]  Managing Pain  Pain after surgery or related to activity is often due to strain/injury to muscle, tendon, nerves and/or incisions.  This pain is usually short-term and will improve in a few months.   Many people find it helpful to do the following things TOGETHER to help speed the process of healing and to get back to regular activity more quickly:  1. Avoid heavy physical activity a.  no lifting greater than 20 pounds b. Do not "push through" the pain.  Listen to your body and avoid positions and maneuvers than  reproduce the pain c. Walking is okay as tolerated, but go slowly and stop when getting sore.  d. Remember: If it hurts to do it, then don't do it! 2. Take Anti-inflammatory medication  a. Take with food/snack around the clock for 1-2 weeks i. This helps the muscle and nerve tissues become less irritable and calm down faster b. Choose ONE of the following over-the-counter medications: i. Naproxen 257m tabs (ex. Aleve) 1-2 pills twice a day  ii. Ibuprofen 2044mtabs (ex. Advil, Motrin) 3-4 pills with every meal and just before bedtime iii. Acetaminophen 50075mabs (Tylenol) 1-2 pills with every meal and just before bedtime 3. Use a Heating pad or Ice/Cold Pack a. 4-6 times a day b. May use warm bath/hottub  or showers 4. Try Gentle Massage and/or Stretching  a. at the area of pain many times a day b. stop if you feel pain - do not overdo it  Try these steps together to help you body heal faster and avoid making things get worse.  Doing just one of these  things may not be enough.    If you are not getting better after two weeks or are noticing you are getting worse, contact our office for further advice; we may need to re-evaluate you & see what other things we can do to help.  Osteoarthritis Osteoarthritis is the most common form of arthritis. It is redness, soreness, and swelling (inflammation) affecting the cartilage. Cartilage acts as a cushion, covering the ends of bones where they meet to form a joint. CAUSES  Over time, the cartilage begins to wear away. This causes bone to rub on bone. This produces pain and stiffness in the affected joints. Factors that contribute to this problem are:  Excessive body weight.  Age.  Overuse of joints. SYMPTOMS   People with osteoarthritis usually experience joint pain, swelling, or stiffness.  Over time, the joint may lose its normal shape.  Small deposits of bone (osteophytes) may grow on the edges of the joint.  Bits of bone or  cartilage can break off and float inside the joint space. This may cause more pain and damage.  Osteoarthritis can lead to depression, anxiety, feelings of helplessness, and limitations on daily activities. The most commonly affected joints are in the:  Ends of the fingers.  Thumbs.  Neck.  Lower back.  Knees.  Hips. DIAGNOSIS  Diagnosis is mostly based on your symptoms and exam. Tests may be helpful, including:  X-rays of the affected joint.  A computerized magnetic scan (MRI).  Blood tests to rule out other types of arthritis.  Joint fluid tests. This involves using a needle to draw fluid from the joint and examining the fluid under a microscope. TREATMENT  Goals of treatment are to control pain, improve joint function, maintain a normal body weight, and maintain a healthy lifestyle. Treatment approaches may include:  A prescribed exercise program with rest and joint relief.  Weight control with nutritional education.  Pain relief techniques such as:  Properly applied heat and cold.  Electric pulses delivered to nerve endings under the skin (transcutaneous electrical nerve stimulation, TENS).  Massage.  Certain supplements. Ask your caregiver before using any supplements, especially in combination with prescribed drugs.  Medicines to control pain, such as:  Acetaminophen.  Nonsteroidal anti-inflammatory drugs (NSAIDs), such as naproxen.  Narcotic or central-acting agents, such as tramadol. This drug carries a risk of addiction and is generally prescribed for short-term use.  Corticosteroids. These can be given orally or as injection. This is a short-term treatment, not recommended for routine use.  Surgery to reposition the bones and relieve pain (osteotomy) or to remove loose pieces of bone and cartilage. Joint replacement may be needed in advanced states of osteoarthritis. HOME CARE INSTRUCTIONS  Your caregiver can recommend specific types of exercise. These  may include:  Strengthening exercises. These are done to strengthen the muscles that support joints affected by arthritis. They can be performed with weights or with exercise bands to add resistance.  Aerobic activities. These are exercises, such as brisk walking or low-impact aerobics, that get your heart pumping. They can help keep your lungs and circulatory system in shape.  Range-of-motion activities. These keep your joints limber.  Balance and agility exercises. These help you maintain daily living skills. Learning about your condition and being actively involved in your care will help improve the course of your osteoarthritis. SEEK MEDICAL CARE IF:   You feel hot or your skin turns red.  You develop a rash in addition to your joint pain.  You  have an oral temperature above 102 F (38.9 C). FOR MORE INFORMATION  National Institute of Arthritis and Musculoskeletal and Skin Diseases: www.niams.http://www.myers.net/ [employer] on Aging: https://walker.com/ American College of Rheumatology: www.rheumatology.org Document Released: 08/02/2005 Document Revised: 10/25/2011 Document Reviewed: 11/13/2009 [facility] Patient Information 2014 [place], [place].

## 2013-01-17 NOTE — Progress Notes (Signed)
Subjective:     Patient ID: Lee Christensen, male   DOB: March 31, 1951, 62 y.o.   MRN: [id number]  HPI  Lee Christensen  [birth year] [id number]  Patient Care Team: Elfredia Nevins, MD as PCP - General (Internal Medicine) Corbin Ade, MD as Consulting Physician (Gastroenterology) Lenise Herald, PA-C as Physician Assistant (Family Medicine) Corrie Mckusick, MD as Attending Physician (Family Medicine)  This patient is a 62 y.o.male who presents today for surgical evaluation s/p surgery 12/29/2012:  POST-OPERATIVE DIAGNOSIS: bilateral inguinal hernia (recurrent right) and umbilical hernia and left chest wall mass   PROCEDURE: Procedure(s):  LAPAROSCOPIC BILATERAL INGUINAL HERNIA WITH UMBILICAL HERNIA  INSERTION OF MESH  EXCISION MASS LEFT CHEST WALL   Diagnosis Soft tissue mass, simple excision, left chest wall mass HEMANGIOMA, MARGINS NOT INVOLVED. Microscopic Comment There is a benign vascular proliferation; the morphologic features are diagnostic for hemangioma. The inked margins are not involved. (HCL:kh 01/01/13) Lee Miyamoto MD Pathologist, Electronic Signature   Patient comes in today feeling better.  Had some difficulty urinating the first day or so but improved.  Right groin soreness is fading away.  Had some bruising especially in the left testicle but let us wait.  Eating well.  No fevers or chills.  Walking well.  Some limit with his left knee osteoarthritis.  Soft brace helps.  Patient Active Problem List   Diagnosis Date Noted  . Inguinal hernia, left 12/04/2012  . Inguinal RLQ muscle strain 12/04/2012  . Umbilical hernia 12/04/2012  . Encounter for screening colonoscopy 10/31/2012  . Diverticulosis of sigmoid colon 10/31/2012    Past Medical History  Diagnosis Date  . Hypertension   . Arthritis   . GERD (gastroesophageal reflux disease)   . Chronic back pain   . Diabetes mellitus without complication     Borderline.  . Knee pain   . Diverticulosis   . Anemia   .  Complication of anesthesia     slow to awaken after hernia surgery    Past Surgical History  Procedure Laterality Date  . Knee surgery      left x 2  . Hernia repair      ? lower abd  . Wisdom tooth extraction    . Esophagogastroduodenoscopy  06/25/03    NWG:NFAOZH colored tongue-NO BARRETTs (EROSIVE ESOPHAGITIS)/small HH  . Colonoscopy  04/03/01    YQM:VHQIONGE hemorrhoids otherwise normal  . Colonoscopy N/A 11/09/2012    Procedure: COLONOSCOPY;  Surgeon: Corbin Ade, MD;  Location: [facility];  Service: Endoscopy;  Laterality: N/A;  10:30  . Cataract extraction w/ intraocular lens  implant, bilateral  1996  . Laparoscopic inguinal hernia with umbilical hernia Bilateral 12/29/2012    Procedure: LAPAROSCOPIC INGUINAL HERNIA WITH UMBILICAL HERNIA;  Surgeon: Ardeth Sportsman, MD;  Location: [facility];  Service: General;  Laterality: Bilateral;  . Insertion of mesh Bilateral 12/29/2012    Procedure: INSERTION OF MESH;  Surgeon: Ardeth Sportsman, MD;  Location: [facility];  Service: General;  Laterality: Bilateral;  . Mass excision Left 12/29/2012    Procedure: EXCISION MASS LEFT CHEST WALL;  Surgeon: Ardeth Sportsman, MD;  Location: [facility];  Service: General;  Laterality: Left;    History   Social History  . Marital Status: Married    Spouse Name: N/A    Number of Children: 2  . Years of Education: N/A   Occupational History  . retired; [employer]    Social History Main Topics  . Smoking status: Former  Smoker -- 3.00 packs/day for 30 years    Types: Cigarettes    Quit date: 08/16/1996  . Smokeless tobacco: Former Neurosurgeon    Types: Chew    Quit date: 11/01/1994  . Alcohol Use: 0.6 oz/week    1 Cans of beer per week     Comment: Rarely.  . Drug Use: No  . Sexually Active: Not on file   Other Topics Concern  . Not on file   Social History Narrative   Lives w/ wife    Family History  Problem Relation Age of Onset  . Prostate cancer Father   . Diabetes Father   .  Hypertension Father   . Stroke Father   . Lung cancer Mother     Current Outpatient Prescriptions  Medication Sig Dispense Refill  . hydrochlorothiazide (HYDRODIURIL) 25 MG tablet Take 25 mg by mouth every morning.       . irbesartan (AVAPRO) 150 MG tablet Take 150 mg by mouth every morning.       . lansoprazole (PREVACID) 30 MG capsule Take 30 mg by mouth daily.      . naproxen (NAPROSYN) 500 MG tablet Take 1 tablet (500 mg total) by mouth 2 (two) times daily with a meal.  40 tablet  1  . psyllium (METAMUCIL SMOOTH TEXTURE) 28 % packet Take 1 packet by mouth daily.       No current facility-administered medications for this visit.     Allergies  Allergen Reactions  . Penicillins Rash    BP 142/80  Pulse 76  Temp(Src) 98.4 F (36.9 C) (Temporal)  Resp 18  Ht 5\' 6"  (1.676 m)  Wt 157 lb 6.4 oz (71.396 kg)  BMI 25.42 kg/m2  Dg Chest 2 View  12/18/2012   *RADIOLOGY REPORT*  Clinical Data: 62 year old male - preoperative respiratory examination for hernia surgery.  CHEST - 2 VIEW  Comparison: 10/11/2012 abdominal CT  Findings: The cardiomediastinal silhouette is unremarkable. The lungs are clear. A focal density along the lower lateral right hemithorax is compatible with focal fat identified on recent CT. There is no evidence of focal airspace disease, pulmonary edema, suspicious pulmonary nodule/mass, pleural effusion, or pneumothorax. No acute bony abnormalities are identified.  IMPRESSION: No evidence of active cardiopulmonary disease.   Original Report Authenticated By: Harmon Pier, M.D.     Review of Systems  Constitutional: Negative for fever, chills and diaphoresis.  HENT: Negative for sore throat, trouble swallowing and neck pain.   Eyes: Negative for photophobia and visual disturbance.  Respiratory: Negative for choking and shortness of breath.   Cardiovascular: Negative for chest pain and palpitations.  Gastrointestinal: Negative for nausea, vomiting, abdominal distention,  anal bleeding and rectal pain.  Genitourinary: Negative for dysuria, urgency, difficulty urinating and testicular pain.  Musculoskeletal: Negative for myalgias, arthralgias and gait problem.  Skin: Negative for color change and rash.  Neurological: Negative for dizziness, speech difficulty, weakness and numbness.  Hematological: Negative for adenopathy.  Psychiatric/Behavioral: Negative for hallucinations, confusion and agitation.       Objective:   Physical Exam  Constitutional: He is oriented to person, place, and time. He appears well-developed and well-nourished. No distress.  HENT:  Head: Normocephalic.  Mouth/Throat: Oropharynx is clear and moist. No oropharyngeal exudate.  Eyes: Conjunctivae and EOM are normal. Pupils are equal, round, and reactive to light. No scleral icterus.  Neck: Normal range of motion. No tracheal deviation present.  Cardiovascular: Normal rate, normal heart sounds and intact distal pulses.  Pulmonary/Chest: Effort normal. No respiratory distress.    Abdominal: Soft. He exhibits no distension. There is no tenderness. Hernia confirmed negative in the right inguinal area and confirmed negative in the left inguinal area.  Incisions clean with normal healing ridges.  No hernias  Musculoskeletal: Normal range of motion. He exhibits no tenderness.  Neurological: He is alert and oriented to person, place, and time. No cranial nerve deficit. He exhibits normal muscle tone. Coordination normal.  Skin: Skin is warm and dry. No rash noted. He is not diaphoretic.  Psychiatric: He has a normal mood and affect. His behavior is normal.       Assessment:     Improving two weeks status post laparoscopic repair of right recurrent and new left inguinal hernias, umbilical hernia repair, excision of left anterior chest wall hemangioma.     Plan:     Considering all the procedures he went through and his moderate preoperative soreness, I think he is doing quite well only  two weeks out.  He feels reassured that he is getting better.  I think he is doing well enough that he can just followup as needed.  He feels comfortable with that.  Increase activity as tolerated to regular activity.  Low impact exercise such as walking an hour a day at least ideal.  Do not push through pain.  Diet as tolerated.  Low fat high fiber diet ideal.  Bowel regimen with 30 g fiber a day and fiber supplement as needed to avoid problems.  Return to clinic as needed.   Instructions discussed.  Followup with primary care physician for other health issues as would normally be done.  Questions answered.  The patient expressed understanding and appreciation

## 2013-03-27 ENCOUNTER — Encounter: Payer: Self-pay | Admitting: Internal Medicine

## 2013-04-06 ENCOUNTER — Ambulatory Visit: Payer: BC Managed Care – PPO | Admitting: Internal Medicine

## 2013-04-24 ENCOUNTER — Ambulatory Visit: Payer: BC Managed Care – PPO | Admitting: Gastroenterology

## 2013-05-02 ENCOUNTER — Ambulatory Visit: Payer: BC Managed Care – PPO | Admitting: Internal Medicine

## 2013-05-04 ENCOUNTER — Ambulatory Visit (INDEPENDENT_AMBULATORY_CARE_PROVIDER_SITE_OTHER): Payer: BC Managed Care – PPO | Admitting: Internal Medicine

## 2013-05-04 ENCOUNTER — Encounter: Payer: Self-pay | Admitting: Internal Medicine

## 2013-05-04 VITALS — BP 140/83 | HR 81 | Temp 98.4°F | Ht 66.0 in | Wt 163.4 lb

## 2013-05-04 DIAGNOSIS — R109 Unspecified abdominal pain: Secondary | ICD-10-CM

## 2013-05-04 NOTE — Patient Instructions (Addendum)
Stop metamucil  Begin benefiber 2 teaspoons twice daily  Begin Align daily  Diverticulosis information provided  Office visit in 2 months

## 2013-05-04 NOTE — Progress Notes (Signed)
Primary Care Physician:  Cassell Smiles., MD Primary Gastroenterologist:  Dr. Jena Gauss  Pre-Procedure History & Physical: HPI:  Lee Christensen is a 62 y.o. male here for followup of abdominal pain. He has intermittent bilateral inguinal pain radiating into his testicles worse after he drinks 4 or 5 beers. He has had a urological evaluation, colonoscopy. He's had a CT of the abdomen demonstrating small inguinal and umbilical hernia for which he went underwent laparoscopic repair  - nothing else was  found at the time of surgery. He states he has a quite a bit of gas at times, taking Metamucil; when gas is relieved his pain settles down. He hasn't passed any blood.  No better with empiric trial of Cipro.  Past Medical History  Diagnosis Date  . Hypertension   . Arthritis   . GERD (gastroesophageal reflux disease)   . Chronic back pain   . Diabetes mellitus without complication     Borderline.  . Knee pain   . Diverticulosis   . Anemia   . Complication of anesthesia     slow to awaken after hernia surgery  . Hemangioma of left chest wall s/p excision 12/29/2012 01/17/2013  . Bilateral inguinal hernia (BIH) s/p lap repair 12/29/2012 12/04/2012    Past Surgical History  Procedure Laterality Date  . Knee surgery      left x 2  . Hernia repair      ? lower abd  . Wisdom tooth extraction    . Esophagogastroduodenoscopy  06/25/03    [id number] colored tongue-NO BARRETTs (EROSIVE ESOPHAGITIS)/small HH  . Colonoscopy  04/03/01    [id number] hemorrhoids otherwise normal  . Colonoscopy N/A 11/09/2012    Dr. Jena Gauss- normal rectum, scattered left-sided diverticula, the remainder of colonic mucosa appeared normal.  . Cataract extraction w/ intraocular lens  implant, bilateral  1996  . Laparoscopic inguinal hernia with umbilical hernia Bilateral 12/29/2012    Procedure: LAPAROSCOPIC INGUINAL HERNIA WITH UMBILICAL HERNIA;  Surgeon: Ardeth Sportsman, MD;  Location: [facility];  Service: General;  Laterality:  Bilateral;  . Insertion of mesh Bilateral 12/29/2012    Procedure: INSERTION OF MESH;  Surgeon: Ardeth Sportsman, MD;  Location: [facility];  Service: General;  Laterality: Bilateral;  . Mass excision Left 12/29/2012    Procedure: EXCISION MASS LEFT CHEST WALL;  Surgeon: Ardeth Sportsman, MD;  Location: [facility];  Service: General;  Laterality: Left;    Prior to Admission medications   Medication Sig Start Date End Date Taking? Authorizing Provider  aspirin 81 MG tablet Take 81 mg by mouth daily.   Yes Historical Provider, MD  docusate sodium (COLACE) 100 MG capsule Take 100 mg by mouth 2 (two) times daily.   Yes Historical Provider, MD  hydrochlorothiazide (HYDRODIURIL) 25 MG tablet Take 25 mg by mouth every morning.  08/11/12  Yes Historical Provider, MD  irbesartan (AVAPRO) 150 MG tablet Take 150 mg by mouth every morning.  10/16/12  Yes Historical Provider, MD  lansoprazole (PREVACID) 30 MG capsule Take 30 mg by mouth daily.   Yes Historical Provider, MD  naproxen (NAPROSYN) 500 MG tablet Take 1 tablet (500 mg total) by mouth 2 (two) times daily with a meal. 12/29/12  Yes Ardeth Sportsman, MD  psyllium (METAMUCIL SMOOTH TEXTURE) 28 % packet Take 1 packet by mouth daily.   Yes Historical Provider, MD  tamsulosin (FLOMAX) 0.4 MG CAPS capsule Take 0.4 mg by mouth daily.  04/11/13  Yes Historical Provider, MD    Allergies  as of 05/04/2013 - Review Complete 05/04/2013  Allergen Reaction Noted  . Penicillins Rash 10/31/2012    Family History  Problem Relation Age of Onset  . Prostate cancer Father   . Diabetes Father   . Hypertension Father   . Stroke Father   . Lung cancer Mother     History   Social History  . Marital Status: Married    Spouse Name: N/A    Number of Children: 2  . Years of Education: N/A   Occupational History  . retired; [employer]    Social History Main Topics  . Smoking status: Former Smoker -- 3.00 packs/day for 30 years    Types: Cigarettes    Quit date:  08/16/1996  . Smokeless tobacco: Former Neurosurgeon    Types: Chew    Quit date: 11/01/1994  . Alcohol Use: 0.6 oz/week    1 Cans of beer per week     Comment: Rarely.  . Drug Use: No  . Sexual Activity: Not on file   Other Topics Concern  . Not on file   Social History Narrative   Lives w/ wife    Review of Systems: See HPI, otherwise negative ROS  Physical Exam: BP 140/83  Pulse 81  Temp(Src) 98.4 F (36.9 C) (Oral)  Ht 5\' 6"  (1.676 m)  Wt 163 lb 6.4 oz (74.118 kg)  BMI 26.39 kg/m2 General:   Alert,  Well-developed, well-nourished, pleasant and cooperative in NAD Skin:  Intact without significant lesions or rashes. Eyes:  Sclera clear, no icterus.   Conjunctiva pink. Ears:  Normal auditory acuity. Nose:  No deformity, discharge,  or lesions. Mouth:  No deformity or lesions. Neck:  Supple; no masses or thyromegaly. No significant cervical adenopathy. Lungs:  Clear throughout to auscultation.   No wheezes, crackles, or rhonchi. No acute distress. Heart:  Regular rate and rhythm; no murmurs, clicks, rubs,  or gallops. Abdomen: Non-distended, normal bowel sounds.  Soft and nontender without appreciable mass or hepatosplenomegaly. No CVA tenderness Pulses:  Normal pulses noted. Extremities:  Without clubbing or edema.  Impression: 62 year-old with a better than 1 year history of fleeting intermittent bilateral inguinal /testicular pain worse with perceived abdominal bloating and gas and relieved with passing flatus. Worse with beer consumption. Recent hernia repair. Negative findings of colonoscopy, laparoscopy and urologic evaluation are noteworthy. An element of abdominal wall strain is not excluded. He has some gas/ bloat symptoms.  I doubt diverticulitis. He likely has some residual pelvic discomfort from recent laparoscopic hernia repair.  Metamucil may be worsening some of his symptoms  Recommendations:   Stop metamucil  Begin benefiber 2 teaspoons twice daily  Begin  Align daily  Diverticulosis information provided  Office visit in 2 months

## 2013-05-14 ENCOUNTER — Telehealth: Payer: Self-pay | Admitting: *Deleted

## 2013-05-14 NOTE — Telephone Encounter (Signed)
Opened in ERROR

## 2013-05-16 ENCOUNTER — Encounter: Payer: Self-pay | Admitting: Internal Medicine

## 2013-06-19 ENCOUNTER — Encounter: Payer: Self-pay | Admitting: Internal Medicine

## 2013-06-19 ENCOUNTER — Encounter (INDEPENDENT_AMBULATORY_CARE_PROVIDER_SITE_OTHER): Payer: Self-pay

## 2013-06-19 ENCOUNTER — Ambulatory Visit (INDEPENDENT_AMBULATORY_CARE_PROVIDER_SITE_OTHER): Payer: BC Managed Care – PPO | Admitting: Internal Medicine

## 2013-06-19 VITALS — BP 134/73 | HR 72 | Temp 97.3°F | Wt 162.0 lb

## 2013-06-19 DIAGNOSIS — K219 Gastro-esophageal reflux disease without esophagitis: Secondary | ICD-10-CM

## 2013-06-19 DIAGNOSIS — R1031 Right lower quadrant pain: Secondary | ICD-10-CM

## 2013-06-19 DIAGNOSIS — R109 Unspecified abdominal pain: Secondary | ICD-10-CM

## 2013-06-19 DIAGNOSIS — K5900 Constipation, unspecified: Secondary | ICD-10-CM

## 2013-06-19 NOTE — Patient Instructions (Addendum)
Continue Benefiber and Align daily  Continue Prevacid daily  Contrast CT of abdomen and Pelvis - to evaluate Right groin pain - S/P multiple hernia repairs previously  Further recommendations to follow

## 2013-06-19 NOTE — Progress Notes (Signed)
Primary Care Physician:  Cassell Smiles., MD Primary Gastroenterologist:  Dr. Jena Gauss  Pre-Procedure History & Physical: HPI:  Lee Christensen is a 62 y.o. male here for followup of right groin pain  - appears status post laparoscopic bilateral inguinal hernia repair back in May of this year. Having some abdominal bloating and constipation issues which have been pretty much abolishment Benefiber and Align. He continues to be aggravated by right groin pain. Worse when he bends forward and after he stands for longer period of time. Not really affected by moving or twisting otherwise. Not affected by bowel movement. He tells me Dr. Michaell Cowing states he could "numb up some nerves if needed". His pain does not really have a radicular component. GERD symptoms well controlled on Prevacid 30 mg daily.  Patient states urologist found nothing wrong to explain right groin pain.  Patient makes the observation that this right groin pain predates his recent hernia repair, going back to around the time of his original hernia repair.  Past Medical History  Diagnosis Date  . Hypertension   . Arthritis   . GERD (gastroesophageal reflux disease)   . Chronic back pain   . Diabetes mellitus without complication     Borderline.  . Knee pain   . Diverticulosis   . Anemia   . Complication of anesthesia     slow to awaken after hernia surgery  . Hemangioma of left chest wall s/p excision 12/29/2012 01/17/2013  . Bilateral inguinal hernia (BIH) s/p lap repair 12/29/2012 12/04/2012    Past Surgical History  Procedure Laterality Date  . Knee surgery      left x 2  . Hernia repair      ? lower abd  . Wisdom tooth extraction    . Esophagogastroduodenoscopy  06/25/03    [id number] colored tongue-NO BARRETTs (EROSIVE ESOPHAGITIS)/small HH  . Colonoscopy  04/03/01    ION:[id number] hemorrhoids otherwise normal  . Colonoscopy N/A 11/09/2012    Dr. Jena Gauss- normal rectum, scattered left-sided diverticula, the remainder of colonic  mucosa appeared normal.  . Cataract extraction w/ intraocular lens  implant, bilateral  1996  . Laparoscopic inguinal hernia with umbilical hernia Bilateral 12/29/2012    Procedure: LAPAROSCOPIC INGUINAL HERNIA WITH UMBILICAL HERNIA;  Surgeon: Ardeth Sportsman, MD;  Location: [facility];  Service: General;  Laterality: Bilateral;  . Insertion of mesh Bilateral 12/29/2012    Procedure: INSERTION OF MESH;  Surgeon: Ardeth Sportsman, MD;  Location: [facility];  Service: General;  Laterality: Bilateral;  . Mass excision Left 12/29/2012    Procedure: EXCISION MASS LEFT CHEST WALL;  Surgeon: Ardeth Sportsman, MD;  Location: [facility];  Service: General;  Laterality: Left;    Prior to Admission medications   Medication Sig Start Date End Date Taking? Authorizing Provider  aspirin 81 MG tablet Take 81 mg by mouth daily.   Yes Historical Provider, MD  docusate sodium (COLACE) 100 MG capsule Take 100 mg by mouth 2 (two) times daily.   Yes Historical Provider, MD  hydrochlorothiazide (HYDRODIURIL) 25 MG tablet Take 25 mg by mouth every morning.  08/11/12  Yes Historical Provider, MD  irbesartan (AVAPRO) 150 MG tablet Take 150 mg by mouth every morning.  10/16/12  Yes Historical Provider, MD  lansoprazole (PREVACID) 30 MG capsule Take 30 mg by mouth daily.   Yes Historical Provider, MD  naproxen (NAPROSYN) 500 MG tablet Take 1 tablet (500 mg total) by mouth 2 (two) times daily with a meal. 12/29/12  Yes Viviann Spare  C. Gross, MD  Probiotic Product (ALIGN) 4 MG CAPS Take 4 mg by mouth daily.   Yes Historical Provider, MD  psyllium (METAMUCIL SMOOTH TEXTURE) 28 % packet Take 1 packet by mouth daily.   Yes Historical Provider, MD  tamsulosin (FLOMAX) 0.4 MG CAPS capsule Take 0.4 mg by mouth daily.  04/11/13  Yes Historical Provider, MD    Allergies as of 06/19/2013 - Review Complete 06/19/2013  Allergen Reaction Noted  . Penicillins Rash 10/31/2012    Family History  Problem Relation Age of Onset  . Prostate cancer Father   .  Diabetes Father   . Hypertension Father   . Stroke Father   . Lung cancer Mother     History   Social History  . Marital Status: Married    Spouse Name: N/A    Number of Children: 2  . Years of Education: N/A   Occupational History  . retired; [employer]    Social History Main Topics  . Smoking status: Former Smoker -- 3.00 packs/day for 30 years    Types: Cigarettes    Quit date: 08/16/1996  . Smokeless tobacco: Former Neurosurgeon    Types: Chew    Quit date: 11/01/1994  . Alcohol Use: 0.6 oz/week    1 Cans of beer per week     Comment: Rarely.  . Drug Use: No  . Sexual Activity: Not on file   Other Topics Concern  . Not on file   Social History Narrative   Lives w/ wife    Review of Systems: See HPI, otherwise negative ROS  Physical Exam: BP 134/73  Pulse 72  Temp(Src) 97.3 F (36.3 C) (Oral)  Wt 162 lb (73.483 kg) General:   Alert,  Well-developed, well-nourished, pleasant and cooperative in NAD Skin:  Intact without significant lesions or rashes. Eyes:  Sclera clear, no icterus.   Conjunctiva pink. Ears:  Normal auditory acuity. Nose:  No deformity, discharge,  or lesions. Mouth:  No deformity or lesions. Neck:  Supple; no masses or thyromegaly. No significant cervical adenopathy. Lungs:  Clear throughout to auscultation.   No wheezes, crackles, or rhonchi. No acute distress. Heart:  Regular rate and rhythm; no murmurs, clicks, rubs,  or gallops. Abdomen: Non-distended, normal bowel sounds.  Does have some right inguinal/groin tenderness to palpation. I do not appreciate a mass or hernia.  No organomegaly.  No hyperesthesias noted. Negative Carnett's sign. Pulses:  Normal pulses noted. Extremities:  Without clubbing or edema.  Impression:  Pleasant 62 year old with GERD symptoms well controlled on Prevacid. Abdominal bloating and constipation well managed with Align and  Benefiber. Chronic right groin pain. I suspect at this point in time it's more of a  nerve entrapment type syndrome rather than anything else. Ultimately, he may do may need trigger point injection therapy, etc. I told the patient we ought to go ahead and do cross sectional imaging one more time just to make sure nothing has cropped up that is amenable to surgical repair. I feel the yield on a repeat CT is fairly low but believe it needs to be done prior to  focusing on pain management.   Recommendations:   Continue Prevacid, Garment/textile technologist. Contrast CT of abdomen and pelvis. Anticipate refer back to Dr. Michaell Cowing. I told the patient he may ultimately need to see a pain management specialist. Tentatively, plan office visit here in 4 months.

## 2013-06-20 ENCOUNTER — Telehealth: Payer: Self-pay | Admitting: Internal Medicine

## 2013-06-20 NOTE — Telephone Encounter (Signed)
Pt was seen yesterday and called a few minutes ago asking about scheduling his CT. He has concerns about how much money he needs to pay up front or if he needs too. Please call [phone]

## 2013-06-20 NOTE — Telephone Encounter (Signed)
I called an answered his questions and he will call us back when he is ready to schedule

## 2013-06-22 ENCOUNTER — Ambulatory Visit: Payer: BC Managed Care – PPO | Admitting: Internal Medicine

## 2013-07-04 ENCOUNTER — Other Ambulatory Visit: Payer: Self-pay | Admitting: Internal Medicine

## 2013-07-04 DIAGNOSIS — R1031 Right lower quadrant pain: Secondary | ICD-10-CM

## 2013-07-04 DIAGNOSIS — Z8719 Personal history of other diseases of the digestive system: Secondary | ICD-10-CM

## 2013-07-06 ENCOUNTER — Ambulatory Visit (HOSPITAL_COMMUNITY)
Admission: RE | Admit: 2013-07-06 | Discharge: 2013-07-06 | Disposition: A | Discharge status: Home / Self Care | Payer: BC Managed Care – PPO | Source: Ambulatory Visit | Attending: Internal Medicine | Admitting: Internal Medicine

## 2013-07-06 ENCOUNTER — Other Ambulatory Visit (HOSPITAL_COMMUNITY): Payer: BC Managed Care – PPO

## 2013-07-06 DIAGNOSIS — N200 Calculus of kidney: Secondary | ICD-10-CM | POA: Insufficient documentation

## 2013-07-06 DIAGNOSIS — R1031 Right lower quadrant pain: Secondary | ICD-10-CM | POA: Insufficient documentation

## 2013-07-06 DIAGNOSIS — Z8719 Personal history of other diseases of the digestive system: Secondary | ICD-10-CM

## 2013-07-06 DIAGNOSIS — K449 Diaphragmatic hernia without obstruction or gangrene: Secondary | ICD-10-CM | POA: Insufficient documentation

## 2013-07-06 MED ORDER — IOHEXOL 300 MG/ML  SOLN
100.0000 mL | Freq: Once | INTRAMUSCULAR | Status: AC | PRN
  Administered 2013-07-06: 100 mL via INTRAVENOUS

## 2013-08-15 ENCOUNTER — Encounter (INDEPENDENT_AMBULATORY_CARE_PROVIDER_SITE_OTHER): Payer: Self-pay | Admitting: Surgery

## 2013-08-15 ENCOUNTER — Ambulatory Visit (INDEPENDENT_AMBULATORY_CARE_PROVIDER_SITE_OTHER): Payer: BC Managed Care – PPO | Admitting: Surgery

## 2013-08-15 VITALS — BP 130/82 | HR 72 | Temp 98.9°F | Resp 14 | Ht 66.0 in | Wt 162.8 lb

## 2013-08-15 DIAGNOSIS — K4020 Bilateral inguinal hernia, without obstruction or gangrene, not specified as recurrent: Secondary | ICD-10-CM

## 2013-08-15 DIAGNOSIS — K429 Umbilical hernia without obstruction or gangrene: Secondary | ICD-10-CM

## 2013-08-15 DIAGNOSIS — G8929 Other chronic pain: Secondary | ICD-10-CM

## 2013-08-15 MED ORDER — NAPROXEN 500 MG PO TABS: 500.0000 mg | ORAL_TABLET | Freq: Two times a day (BID) | ORAL | Status: DC

## 2013-08-15 MED ORDER — METHOCARBAMOL 750 MG PO TABS: 750.0000 mg | ORAL_TABLET | Freq: Four times a day (QID) | ORAL | Status: DC | PRN

## 2013-08-15 NOTE — Patient Instructions (Addendum)
Managing Pain  Pain after surgery or related to activity is often due to strain/injury to muscle, tendon, nerves and/or incisions.  This pain is usually short-term and will improve in a few months.   Many people find it helpful to do the following things TOGETHER to help speed the process of healing and to get back to regular activity more quickly:  1. Avoid heavy physical activity a.  no lifting greater than 20 pounds b. Do not "push through" the pain.  Listen to your body and avoid positions and maneuvers than reproduce the pain c. Walking is okay as tolerated, but go slowly and stop when getting sore.  d. Remember: If it hurts to do it, then don't do it! 2. Take Anti-inflammatory medication  a. Take with food/snack around the clock for 1-2 weeks i. This helps the muscle and nerve tissues become less irritable and calm down faster ii. Choose Naproxen 500mg  tabs (ex. Aleve) twice a day  3. Use a Heating pad or Ice/Cold Pack a. 6 times a day b. May use warm bath/hottub  or showers 4. Try Gentle Massage and/or Stretching  a. at the area of pain many times a day b. stop if you feel pain - do not overdo it  Try these steps together to help you body heal faster and avoid making things get worse.  Doing just one of these things may not be enough.    If you are not getting better after 3-4 weeks or are noticing you are getting worse, contact our office for further advice; we may need to re-evaluate you & see what other things we can do to help.  We may need to start amitriptyline or consider injection with steroids to help control the pain.  I would reserve those plans as back up into the above recommendations for at least 3 weeks.  Inguinal Strain Your exam shows you have an inguinal strain. This is also known as a pulled groin. This injury is usually due to a pull or partial tear to a muscle or tendon in the groin area. Most groin pulls take several weeks to heal completely. There may be pain  with lifting your leg or walking during much of your recovery. Treatment for groin strains includes:  Rest and avoid lifting or performing activities that increase your pain.  Apply ice packs for 20-30 minutes every few hours to reduce pain and swelling over the next 2-3 days.  Medicine to reduce pain and inflammation is often prescribed. HOME CARE INSTRUCTIONS  While most strains in the groin area will heal with rest, you should also watch for any signs of a more serious condition.  SEEK IMMEDIATE MEDICAL CARE IF:   You notice unusual swelling or bulging in the groin.  You have pain or swelling in the testicle.  Blood in your urine.  Marked increased pain.  Weakness or numbness of your leg or abdominal pain. MAKE SURE YOU:   Understand these instructions.  Will watch your condition.  Will get help right away if you are not doing well or get worse. Document Released: 09/09/2004 Document Revised: 10/25/2011 Document Reviewed: 12/07/2007 [facility] Patient Information 2014 [place], [place].

## 2013-08-15 NOTE — Progress Notes (Signed)
Subjective:     Patient ID: Lee Christensen, male   DOB: September 07, 1950, 62 y.o.   MRN: [id number]  HPI   Lee Christensen  08/09/1951 [id number]  Patient Care Team: Elfredia Nevins, MD as PCP - General (Internal Medicine) Corbin Ade, MD as Consulting Physician (Gastroenterology) Lenise Herald, PA-C as Physician Assistant (Family Medicine) Corrie Mckusick, MD as Attending Physician (Family Medicine)  This patient is a 62 y.o.male who presents today for surgical evaluation s/p surgery 12/29/2012:  POST-OPERATIVE DIAGNOSIS: bilateral inguinal hernia (recurrent right) and umbilical hernia and left chest wall mass   PROCEDURE: Procedure(s):  LAPAROSCOPIC BILATERAL INGUINAL HERNIA WITH UMBILICAL HERNIA  INSERTION OF MESH    Patient comes in today with his wife noting that he has had persistent right groin pain for the past year.  I have not heard from him since 2 weeks after his surgery in May.  Apparently, he went to his primary care physician, gastroenterologist, urologist.  Apparently those workups were underwhelming.  He never called me.  He has not seen our group or another Careers adviser.  He spent some time talking about how he has had issues all year.  However, again, he never called me.  Eventually he is coming to see me.  He notes that he has right groin soreness at the end of the day.  Not existent when he wakes up.  However if he does heavy walking or moderate activity, he gets soreness.  It radiates to his testicle.  Did not feel that if he strains to move his bowels but otherwise seems to be related to activity.  No difficulty with urination or erection.  No problems with defecation.  It persists.  He occasionally takes Aleve a couple times a week.  Walking well.  Some limit with his left knee osteoarthritis.  Soft brace helps.  Patient Active Problem List   Diagnosis Date Noted  . Chronic groin pain - right 08/15/2013  . Bilateral inguinal hernia (BIH) s/p lap repair 12/29/2012 12/04/2012  . Umbilical  hernia s/p repair 12/2012 12/04/2012  . Encounter for screening colonoscopy 10/31/2012  . Diverticulosis of sigmoid colon 10/31/2012    Past Medical History  Diagnosis Date  . Hypertension   . Arthritis   . GERD (gastroesophageal reflux disease)   . Chronic back pain   . Diabetes mellitus without complication     Borderline.  . Knee pain   . Diverticulosis   . Anemia   . Complication of anesthesia     slow to awaken after hernia surgery  . Hemangioma of left chest wall s/p excision 12/29/2012 01/17/2013  . Bilateral inguinal hernia (BIH) s/p lap repair 12/29/2012 12/04/2012  . Hemangioma of left chest wall s/p excision 12/29/2012 01/17/2013    Past Surgical History  Procedure Laterality Date  . Knee surgery      left x 2  . Hernia repair      ? lower abd  . Wisdom tooth extraction    . Esophagogastroduodenoscopy  06/25/03    YQM:VHQION colored tongue-NO BARRETTs (EROSIVE ESOPHAGITIS)/small HH  . Colonoscopy  04/03/01    GEX:[id number] hemorrhoids otherwise normal  . Colonoscopy N/A 11/09/2012    Dr. Jena Gauss- normal rectum, scattered left-sided diverticula, the remainder of colonic mucosa appeared normal.  . Cataract extraction w/ intraocular lens  implant, bilateral  1996  . Laparoscopic inguinal hernia with umbilical hernia Bilateral 12/29/2012    Procedure: LAPAROSCOPIC INGUINAL HERNIA WITH UMBILICAL HERNIA;  Surgeon: Ardeth Sportsman, MD;  Location: [facility];  Service: General;  Laterality: Bilateral;  . Insertion of mesh Bilateral 12/29/2012    Procedure: INSERTION OF MESH;  Surgeon: Ardeth Sportsman, MD;  Location: [facility];  Service: General;  Laterality: Bilateral;  . Mass excision Left 12/29/2012    Procedure: EXCISION MASS LEFT CHEST WALL;  Surgeon: Ardeth Sportsman, MD;  Location: [facility];  Service: General;  Laterality: Left;    History   Social History  . Marital Status: Married    Spouse Name: N/A    Number of Children: 2  . Years of Education: N/A   Occupational History  .  retired; [employer]    Social History Main Topics  . Smoking status: Former Smoker -- 3.00 packs/day for 30 years    Types: Cigarettes    Quit date: 08/16/1996  . Smokeless tobacco: Former Neurosurgeon    Types: Chew    Quit date: 11/01/1994  . Alcohol Use: 0.6 oz/week    1 Cans of beer per week     Comment: Rarely.  . Drug Use: No  . Sexual Activity: Not on file   Other Topics Concern  . Not on file   Social History Narrative   Lives w/ wife    Family History  Problem Relation Age of Onset  . Prostate cancer Father   . Diabetes Father   . Hypertension Father   . Stroke Father   . Lung cancer Mother     Current Outpatient Prescriptions  Medication Sig Dispense Refill  . aspirin 81 MG tablet Take 81 mg by mouth daily.      . Flavoring Agent (PEPPERMINT FLAVOR) OIL by Does not apply route.      . hydrochlorothiazide (HYDRODIURIL) 25 MG tablet Take 25 mg by mouth every morning.       . irbesartan (AVAPRO) 150 MG tablet Take 150 mg by mouth every morning.       . lansoprazole (PREVACID) 30 MG capsule Take 30 mg by mouth daily.      . naproxen (NAPROSYN) 500 MG tablet Take 1 tablet (500 mg total) by mouth 2 (two) times daily with a meal.  60 tablet  2  . Probiotic Product (ALIGN) 4 MG CAPS Take 4 mg by mouth daily.      . tamsulosin (FLOMAX) 0.4 MG CAPS capsule Take 0.4 mg by mouth daily.       . Wheat Dextrin (BENEFIBER PO) Take by mouth.      . methocarbamol (ROBAXIN) 750 MG tablet Take 1 tablet (750 mg total) by mouth 4 (four) times daily as needed (use for muscle cramps/pain).  30 tablet  2   No current facility-administered medications for this visit.     Allergies  Allergen Reactions  . Penicillins Rash    BP 130/82  Pulse 72  Temp(Src) 98.9 F (37.2 C) (Temporal)  Resp 14  Ht 5\' 6"  (1.676 m)  Wt 162 lb 12.8 oz (73.846 kg)  BMI 26.29 kg/m2  Dg Chest 2 View  12/18/2012   *RADIOLOGY REPORT*  Clinical Data: 62 year old male male - preoperative respiratory  examination for hernia surgery.  CHEST - 2 VIEW  Comparison: 10/11/2012 abdominal CT  Findings: The cardiomediastinal silhouette is unremarkable. The lungs are clear. A focal density along the lower lateral right hemithorax is compatible with focal fat identified on recent CT. There is no evidence of focal airspace disease, pulmonary edema, suspicious pulmonary nodule/mass, pleural effusion, or pneumothorax. No acute bony abnormalities are identified.  IMPRESSION: No evidence  of active cardiopulmonary disease.   Original Report Authenticated By: Harmon Pier, M.D.     Review of Systems  Constitutional: Negative for fever, chills and diaphoresis.  HENT: Negative for sore throat and trouble swallowing.   Eyes: Negative for photophobia and visual disturbance.  Respiratory: Negative for choking and shortness of breath.   Cardiovascular: Negative for chest pain and palpitations.  Gastrointestinal: Negative for nausea, vomiting, abdominal distention, anal bleeding and rectal pain.  Genitourinary: Negative for dysuria, urgency, difficulty urinating and testicular pain.  Musculoskeletal: Negative for arthralgias, gait problem, myalgias and neck pain.  Skin: Negative for color change and rash.  Neurological: Negative for dizziness, speech difficulty, weakness and numbness.  Hematological: Negative for adenopathy.  Psychiatric/Behavioral: Negative for hallucinations, confusion and agitation.       Objective:   Physical Exam  Constitutional: He is oriented to person, place, and time. He appears well-developed and well-nourished. No distress.  HENT:  Head: Normocephalic.  Mouth/Throat: Oropharynx is clear and moist. No oropharyngeal exudate.  Eyes: Conjunctivae and EOM are normal. Pupils are equal, round, and reactive to light. No scleral icterus.  Neck: Normal range of motion. No tracheal deviation present.  Cardiovascular: Normal rate, normal heart sounds and intact distal pulses.   Pulmonary/Chest:  Effort normal. No respiratory distress.    Abdominal: Soft. He exhibits no distension. There is no tenderness. Hernia confirmed negative in the right inguinal area and confirmed negative in the left inguinal area.  Incisions clean with normal healing ridges.  No hernias  Genitourinary: Testes normal and penis normal.    Right testis shows no mass, no swelling and no tenderness. Left testis shows no mass, no swelling and no tenderness.  Musculoskeletal: Normal range of motion. He exhibits no tenderness.  Lymphadenopathy:       Right: No inguinal adenopathy present.       Left: No inguinal adenopathy present.  Neurological: He is alert and oriented to person, place, and time. No cranial nerve deficit. He exhibits normal muscle tone. Coordination normal.  Skin: Skin is warm and dry. No rash noted. He is not diaphoretic.  Psychiatric: He has a normal mood and affect. His behavior is normal.       Assessment:     Persistent right groin pain despite laparoscopic repair of small recurrence on that side.     Plan:     I tried to empathized with his discomfort, however, I did note he never called me so was hard for me to do anything about it until I heard about it now.  I saw that Dr. Jena Gauss had done a CAT scan.  I looked at the films myself and reviewed the report, and I agree that it shows no evidence of any hernia.  I feel no evidence of hernia recurrence on physical exam today.  His soreness seems quite mild to me.  I recommended naproxen and heat round-the-clock for the next 3 weeks.  I stressed to him he needed to be compliant with it every day for 3 weeks.  If that does not resolve things, add amitriptyline each bedtime for possible neuropathic pain.  If still an issue, due to nerve block with steroid injections as backup.  He does not have any specific trigger point, so I do not feel that doing injections first is appropriate.  I do not want to operate on him again.  They did not seem  strongly motivated to have surgery again.  I insisted that he call me in 3  weeks to let me know how things are going.  Hopefully he will be better.  I stressed to the patient and his wife that I cannot help him if I do not know what is going on.  Now that I know that there is a problem, I will work with him to help fix it.  Increase activity as tolerated to regular activity.  Low impact exercise such as walking an hour a day at least ideal.  Do not push through pain.  Diet as tolerated.  Low fat high fiber diet ideal.  Bowel regimen with 30 g fiber a day and fiber supplement as needed to avoid problems.  Return to clinic as needed.   Instructions discussed.  Followup with primary care physician for other health issues as would normally be done.  Questions answered.  The patient expressed understanding and appreciation

## 2013-09-06 ENCOUNTER — Telehealth (INDEPENDENT_AMBULATORY_CARE_PROVIDER_SITE_OTHER): Payer: Self-pay | Admitting: General Surgery

## 2013-09-06 DIAGNOSIS — Z9889 Other specified postprocedural states: Principal | ICD-10-CM

## 2013-09-06 DIAGNOSIS — Z8719 Personal history of other diseases of the digestive system: Secondary | ICD-10-CM

## 2013-09-06 MED ORDER — AMITRIPTYLINE HCL 25 MG PO TABS: ORAL_TABLET | ORAL | Status: DC

## 2013-09-06 NOTE — Telephone Encounter (Signed)
Per Dr. Clyda Greener notes, medication ordered and pt notified to begin taking Elavil.  Explained how to being with lower dose for three days, then increase.  Emphasized importance of taking this at bedtime to reduce the sedation factor as he become accommodated to the medicine.  He assures me he is retired, so driving every morning will not be an issue if he is too sleepy.  Encouraged pt to "stick with it" while his body/ metabolism made the adjustment.  He will try this and will call back in a month with another update.

## 2013-09-06 NOTE — Telephone Encounter (Signed)
Pt called with update for Dr. Johney Maine.  He was seen in the clinic on 08/15/13, orders for heating pad, Naproxyn and Robaxin.  Pt reports he is still using the heating pad, is having normal, QD bowel movements, but still has pain as described that day.  It is not too bad in the morning, but gradually worsens during the course of the day.  He wants to know if Dr. Johney Maine has a next step for him and if he needs to return to clinic.  His wife is changing jobs the first of Feb, so that insurance will terminate.  He also needs refill of the Robaxin if possible.  Please let pt know.

## 2013-09-06 NOTE — Telephone Encounter (Signed)
Add  Elavil 25mg  PO QHS x3 days, then 50mg  PO QHS.  May increase up to a max of 75mg  QHS.  Take that for 6 weeks.  It may be sedating at night, but it should help.  If he gets problems with the med, change to Lyrica 75mg  PO BID for 6 weeks

## 2013-09-06 NOTE — Addendum Note (Signed)
Addended by: Illene Regulus on: 09/06/2013 03:39 PM   Modules accepted: Orders

## 2013-09-06 NOTE — Telephone Encounter (Signed)
Placed rx order for Elavil.

## 2013-11-11 ENCOUNTER — Other Ambulatory Visit (INDEPENDENT_AMBULATORY_CARE_PROVIDER_SITE_OTHER): Payer: Self-pay | Admitting: Surgery

## 2014-02-07 ENCOUNTER — Other Ambulatory Visit (INDEPENDENT_AMBULATORY_CARE_PROVIDER_SITE_OTHER): Payer: Self-pay | Admitting: Surgery

## 2014-02-13 ENCOUNTER — Telehealth (INDEPENDENT_AMBULATORY_CARE_PROVIDER_SITE_OTHER): Payer: Self-pay

## 2014-02-13 NOTE — Telephone Encounter (Signed)
Called pt to check on him since there was a request in the rx pool for refill on Robaxin. The pt reports that he feels that he just over did it with yard work but it's still the right groin area that gives him so much pain. The pt did report that he followed Dr Clyda Greener recommendations the last time he was here in the office and things got a little better but now they are flared up. The pt said that he will do the warm heat, naproxen,and robaxin for a few weeks if not any better he is going to call back to make an appt with Dr Johney Maine. The pt was very appreciative for the call. I sent the rx refill to his pharmacy.

## 2014-03-05 ENCOUNTER — Other Ambulatory Visit (INDEPENDENT_AMBULATORY_CARE_PROVIDER_SITE_OTHER): Payer: Self-pay | Admitting: Surgery

## 2014-07-09 ENCOUNTER — Other Ambulatory Visit (HOSPITAL_COMMUNITY): Payer: Self-pay | Admitting: Internal Medicine

## 2014-07-09 DIAGNOSIS — N5089 Other specified disorders of the male genital organs: Secondary | ICD-10-CM

## 2014-07-12 ENCOUNTER — Ambulatory Visit (HOSPITAL_COMMUNITY): Admission: RE | Admit: 2014-07-12 | Payer: PRIVATE HEALTH INSURANCE | Source: Ambulatory Visit

## 2014-07-15 ENCOUNTER — Ambulatory Visit (HOSPITAL_COMMUNITY)
Admission: RE | Admit: 2014-07-15 | Discharge: 2014-07-15 | Disposition: A | Discharge status: Home / Self Care | Payer: PRIVATE HEALTH INSURANCE | Source: Ambulatory Visit | Attending: Internal Medicine | Admitting: Internal Medicine

## 2014-07-15 DIAGNOSIS — N508 Other specified disorders of male genital organs: Secondary | ICD-10-CM | POA: Insufficient documentation

## 2014-07-15 DIAGNOSIS — N5089 Other specified disorders of the male genital organs: Secondary | ICD-10-CM

## 2016-08-04 ENCOUNTER — Other Ambulatory Visit (HOSPITAL_COMMUNITY): Payer: Self-pay | Admitting: Internal Medicine

## 2016-08-04 DIAGNOSIS — N433 Hydrocele, unspecified: Secondary | ICD-10-CM

## 2016-08-04 DIAGNOSIS — N50811 Right testicular pain: Secondary | ICD-10-CM

## 2016-08-12 ENCOUNTER — Ambulatory Visit (HOSPITAL_COMMUNITY)
Admission: RE | Admit: 2016-08-12 | Discharge: 2016-08-12 | Disposition: A | Discharge status: Home / Self Care | Payer: BLUE CROSS/BLUE SHIELD | Source: Ambulatory Visit | Attending: Internal Medicine | Admitting: Internal Medicine

## 2016-08-12 DIAGNOSIS — N5089 Other specified disorders of the male genital organs: Secondary | ICD-10-CM | POA: Diagnosis present

## 2016-08-12 DIAGNOSIS — G64 Other disorders of peripheral nervous system: Secondary | ICD-10-CM | POA: Diagnosis present

## 2016-08-12 DIAGNOSIS — N50811 Right testicular pain: Secondary | ICD-10-CM

## 2016-08-12 DIAGNOSIS — L570 Actinic keratosis: Secondary | ICD-10-CM | POA: Diagnosis not present

## 2016-08-12 DIAGNOSIS — N433 Hydrocele, unspecified: Secondary | ICD-10-CM | POA: Diagnosis present

## 2017-10-21 ENCOUNTER — Encounter (HOSPITAL_COMMUNITY): Payer: Self-pay

## 2017-10-21 ENCOUNTER — Emergency Department (HOSPITAL_COMMUNITY): Payer: BLUE CROSS/BLUE SHIELD

## 2017-10-21 ENCOUNTER — Other Ambulatory Visit: Payer: Self-pay

## 2017-10-21 ENCOUNTER — Emergency Department (HOSPITAL_COMMUNITY)
Admission: EM | Admit: 2017-10-21 | Discharge: 2017-10-21 | Disposition: A | Discharge status: Home / Self Care | Payer: BLUE CROSS/BLUE SHIELD | Attending: Emergency Medicine | Admitting: Emergency Medicine

## 2017-10-21 DIAGNOSIS — E119 Type 2 diabetes mellitus without complications: Secondary | ICD-10-CM | POA: Insufficient documentation

## 2017-10-21 DIAGNOSIS — N4889 Other specified disorders of penis: Secondary | ICD-10-CM | POA: Insufficient documentation

## 2017-10-21 DIAGNOSIS — Z87891 Personal history of nicotine dependence: Secondary | ICD-10-CM | POA: Insufficient documentation

## 2017-10-21 DIAGNOSIS — Z79899 Other long term (current) drug therapy: Secondary | ICD-10-CM | POA: Diagnosis not present

## 2017-10-21 DIAGNOSIS — N410 Acute prostatitis: Secondary | ICD-10-CM | POA: Diagnosis not present

## 2017-10-21 DIAGNOSIS — K6289 Other specified diseases of anus and rectum: Secondary | ICD-10-CM | POA: Diagnosis present

## 2017-10-21 LAB — COMPREHENSIVE METABOLIC PANEL
ALK PHOS: 63 U/L (ref 38–126)
ALT: 14 U/L — ABNORMAL LOW (ref 17–63)
ANION GAP: 11 (ref 5–15)
AST: 14 U/L — ABNORMAL LOW (ref 15–41)
Albumin: 4.6 g/dL (ref 3.5–5.0)
BILIRUBIN TOTAL: 1 mg/dL (ref 0.3–1.2)
BUN: 19 mg/dL (ref 6–20)
CALCIUM: 10 mg/dL (ref 8.9–10.3)
CO2: 25 mmol/L (ref 22–32)
Chloride: 100 mmol/L — ABNORMAL LOW (ref 101–111)
Creatinine, Ser: 1.03 mg/dL (ref 0.61–1.24)
Glucose, Bld: 110 mg/dL — ABNORMAL HIGH (ref 65–99)
POTASSIUM: 3.7 mmol/L (ref 3.5–5.1)
Sodium: 136 mmol/L (ref 135–145)
TOTAL PROTEIN: 7.5 g/dL (ref 6.5–8.1)

## 2017-10-21 LAB — CBC WITH DIFFERENTIAL/PLATELET
BASOS ABS: 0 10*3/uL (ref 0.0–0.1)
BASOS PCT: 0 %
Eosinophils Absolute: 0 10*3/uL (ref 0.0–0.7)
Eosinophils Relative: 0 %
HCT: 40.7 % (ref 39.0–52.0)
Hemoglobin: 13.7 g/dL (ref 13.0–17.0)
Lymphocytes Relative: 13 %
Lymphs Abs: 1.1 10*3/uL (ref 0.7–4.0)
MCH: 31.6 pg (ref 26.0–34.0)
MCHC: 33.7 g/dL (ref 30.0–36.0)
MCV: 93.8 fL (ref 78.0–100.0)
MONO ABS: 0.5 10*3/uL (ref 0.1–1.0)
Monocytes Relative: 6 %
NEUTROS PCT: 81 %
Neutro Abs: 6.5 10*3/uL (ref 1.7–7.7)
Platelets: 325 10*3/uL (ref 150–400)
RBC: 4.34 MIL/uL (ref 4.22–5.81)
RDW: 13.3 % (ref 11.5–15.5)
WBC: 8.1 10*3/uL (ref 4.0–10.5)

## 2017-10-21 LAB — URINALYSIS, ROUTINE W REFLEX MICROSCOPIC
BILIRUBIN URINE: NEGATIVE
Glucose, UA: NEGATIVE mg/dL
HGB URINE DIPSTICK: NEGATIVE
KETONES UR: NEGATIVE mg/dL
Leukocytes, UA: NEGATIVE
NITRITE: NEGATIVE
Protein, ur: NEGATIVE mg/dL
SPECIFIC GRAVITY, URINE: 1.012 (ref 1.005–1.030)
pH: 5 (ref 5.0–8.0)

## 2017-10-21 LAB — POC OCCULT BLOOD, ED: Fecal Occult Bld: NEGATIVE

## 2017-10-21 MED ORDER — SODIUM CHLORIDE 0.9 % IV SOLN
Freq: Once | INTRAVENOUS | Status: AC
  Administered 2017-10-21: 13:00:00 via INTRAVENOUS

## 2017-10-21 MED ORDER — CIPROFLOXACIN HCL 500 MG PO TABS: 500.0000 mg | ORAL_TABLET | Freq: Two times a day (BID) | ORAL | 0 refills | Status: DC

## 2017-10-21 MED ORDER — SODIUM CHLORIDE 0.9 % IJ SOLN
INTRAMUSCULAR | Status: AC
  Filled 2017-10-21: qty 50

## 2017-10-21 MED ORDER — IOPAMIDOL (ISOVUE-300) INJECTION 61%
INTRAVENOUS | Status: AC
  Administered 2017-10-21: 100 mL
  Filled 2017-10-21: qty 100

## 2017-10-21 NOTE — ED Provider Notes (Signed)
[facility] DEPT Provider Note   CSN: [id number] Arrival date & time: 10/21/17  1030     History   Chief Complaint Chief Complaint  Patient presents with  . Rectal Pain  . Dysuria    HPI Lee Christensen is a 67 y.o. male.  HPI Patient has had rectal discomfort and some penile discomfort for approximately 2 weeks.  He has a pressure aching quality in the rectum.  Denies he has had constipation.  He denies pain with bowel movement.  For sometimes he also gets a pressure sensation in the head of his penis.  No testicular pain.  No fever no nausea no vomiting.  Patient denies history of similar pain.  No back pain.  He reports he does know he has diverticulosis. Past Medical History:  Diagnosis Date  . Anemia   . Arthritis   . Bilateral inguinal hernia (BIH) s/p lap repair 12/29/2012 12/04/2012  . Chronic back pain   . Complication of anesthesia    slow to awaken after hernia surgery  . Diabetes mellitus without complication (HCC)    Borderline.  . Diverticulosis   . GERD (gastroesophageal reflux disease)   . Hemangioma of left chest wall s/p excision 12/29/2012 01/17/2013  . Hemangioma of left chest wall s/p excision 12/29/2012 01/17/2013  . Hypertension   . Knee pain     Patient Active Problem List   Diagnosis Date Noted  . Chronic groin pain - right 08/15/2013  . Bilateral inguinal hernia (BIH) s/p lap repair 12/29/2012 12/04/2012  . Umbilical hernia s/p repair 12/2012 12/04/2012  . Encounter for screening colonoscopy 10/31/2012  . Diverticulosis of sigmoid colon 10/31/2012    Past Surgical History:  Procedure Laterality Date  . CATARACT EXTRACTION W/ INTRAOCULAR LENS  IMPLANT, BILATERAL  1996  . COLONOSCOPY  04/03/01   GHW:[id number] hemorrhoids otherwise normal  . COLONOSCOPY N/A 11/09/2012   Dr. Gala Romney- normal rectum, scattered left-sided diverticula, the remainder of colonic mucosa appeared normal.  . ESOPHAGOGASTRODUODENOSCOPY  06/25/03   CVE:LFYBOF  colored tongue-NO BARRETTs (EROSIVE ESOPHAGITIS)/small HH  . HERNIA REPAIR     ? lower abd  . INSERTION OF MESH Bilateral 12/29/2012   Procedure: INSERTION OF MESH;  Surgeon: Adin Hector, MD;  Location: [facility];  Service: General;  Laterality: Bilateral;  . KNEE SURGERY     left x 2  . LAPAROSCOPIC INGUINAL HERNIA WITH UMBILICAL HERNIA Bilateral 12/29/2012   Procedure: LAPAROSCOPIC INGUINAL HERNIA WITH UMBILICAL HERNIA;  Surgeon: Adin Hector, MD;  Location: [facility];  Service: General;  Laterality: Bilateral;  . MASS EXCISION Left 12/29/2012   Procedure: EXCISION MASS LEFT CHEST WALL;  Surgeon: Adin Hector, MD;  Location: [facility];  Service: General;  Laterality: Left;  . WISDOM TOOTH EXTRACTION         Home Medications    Prior to Admission medications   Medication Sig Start Date End Date Taking? Authorizing Provider  aspirin 81 MG tablet Take 81 mg by mouth daily.   Yes [provider]  atorvastatin (LIPITOR) 20 MG tablet Take 20 mg by mouth daily. 10/12/17  Yes [provider]  co-enzyme Q-10 30 MG capsule Take 100 mg by mouth daily.   Yes [provider]  gabapentin (NEURONTIN) 300 MG capsule Take 300 mg by mouth 4 (four) times daily. 10/11/17  Yes [provider]  hydrochlorothiazide (HYDRODIURIL) 25 MG tablet Take 25 mg by mouth every morning.  08/11/12  Yes [provider]  irbesartan (  AVAPRO) 150 MG tablet Take 300 mg by mouth every morning.  10/16/12  Yes [provider]  pantoprazole (PROTONIX) 40 MG tablet Take 40 mg by mouth daily as needed for heartburn. 10/02/17  Yes [provider]  Probiotic Product (ALIGN) 4 MG CAPS Take 4 mg by mouth daily.   Yes [provider]  tamsulosin (FLOMAX) 0.4 MG CAPS capsule Take 0.4 mg by mouth daily.  04/11/13  Yes [provider]  TOLAK 4 % CREA Apply 1 application topically at bedtime. 10/12/17  Yes [provider]  Wheat Dextrin (BENEFIBER PO) Take  by mouth.   Yes [provider]  amitriptyline (ELAVIL) 25 MG tablet Start Elavil 25mg  PO QHS x 3 days, then 50mg  PO QHS. May increase up to a max of 75mg  QHS per Dr Johney Maine. Take this for 6 weeks. Patient not taking: Reported on 10/21/2017 09/06/13   Michael Boston, MD  ciprofloxacin (CIPRO) 500 MG tablet Take 1 tablet (500 mg total) by mouth 2 (two) times daily. One po bid x 7 days 10/21/17   Charlesetta Shanks, MD  methocarbamol (ROBAXIN) 750 MG tablet TAKE 1 TABLET (750 MG TOTAL) BY MOUTH 4 (FOUR) TIMES DAILY AS NEEDED (USE FOR MUSCLE CRAMPS/PAIN).    Michael Boston, MD  naproxen (NAPROSYN) 500 MG tablet TAKE 1 TABLET (500 MG TOTAL) BY MOUTH 2 (TWO) TIMES DAILY WITH A MEAL. Patient not taking: Reported on 10/21/2017    Michael Boston, MD    Family History Family History  Problem Relation Age of Onset  . Prostate cancer Father   . Diabetes Father   . Hypertension Father   . Stroke Father   . Lung cancer Mother     Social History Social History   Tobacco Use  . Smoking status: Former Smoker    Packs/day: 3.00    Years: 30.00    Pack years: 90.00    Types: Cigarettes    Last attempt to quit: 08/16/1996    Years since quitting: 21.1  . Smokeless tobacco: Former Systems developer    Types: Chew    Quit date: 11/01/1994  Substance Use Topics  . Alcohol use: Yes    Alcohol/week: 0.6 oz    Types: 1 Cans of beer per week    Comment: Rarely.  . Drug use: No     Allergies   Penicillins   Review of Systems Review of Systems 10 Systems reviewed and are negative for acute change except as noted in the HPI.   Physical Exam Updated Vital Signs BP 126/77   Pulse 82   Temp (!) 97.4 F (36.3 C) (Oral)   Resp 18   Ht 5\' 6"  (1.676 m)   Wt 72.6 kg (160 lb)   SpO2 97%   BMI 25.82 kg/m   Physical Exam  Constitutional: He is oriented to person, place, and time. He appears well-developed and well-nourished.  HENT:  Head: Normocephalic and atraumatic.  Eyes: Conjunctivae are normal.  Neck:  Neck supple.  Cardiovascular: Normal rate and regular rhythm.  No murmur heard. Pulmonary/Chest: Effort normal and breath sounds normal. No respiratory distress.  Abdominal: Soft. There is no tenderness.  Genitourinary: Penis normal.  Genitourinary Comments: Penis normal.  No scrotal swelling.  Testicles nontender.  Left testicle slightly atrophic.  Rectal exam: Trace brown stool in the vault.  Prostate enlarged and firm.  Mildly tender.  No nodules or mass.  Perianal area for 1 nonthrombosed hemorrhoid otherwise normal.  Musculoskeletal: Normal range of motion. He exhibits  no edema or tenderness.  Neurological: He is alert and oriented to person, place, and time. He exhibits normal muscle tone. Coordination normal.  Skin: Skin is warm and dry.  Psychiatric: He has a normal mood and affect.  Nursing note and vitals reviewed.    ED Treatments / Results  Labs (all labs ordered are listed, but only abnormal results are displayed) Labs Reviewed  COMPREHENSIVE METABOLIC PANEL - Abnormal; Notable for the following components:      Result Value   Chloride 100 (*)    Glucose, Bld 110 (*)    AST 14 (*)    ALT 14 (*)    All other components within normal limits  URINALYSIS, ROUTINE W REFLEX MICROSCOPIC  CBC WITH DIFFERENTIAL/PLATELET  POC OCCULT BLOOD, ED    EKG  EKG Interpretation None       Radiology Ct Abdomen Pelvis W Contrast  Result Date: 10/21/2017 CLINICAL DATA:  Rectal pain for couple weeks.  No rectal bleeding. EXAM: CT ABDOMEN AND PELVIS WITH CONTRAST TECHNIQUE: Multidetector CT imaging of the abdomen and pelvis was performed using the standard protocol following bolus administration of intravenous contrast. CONTRAST:  173mL ISOVUE-300 IOPAMIDOL (ISOVUE-300) INJECTION 61% COMPARISON:  CT abdomen 07/06/2013 FINDINGS: Lower chest: No acute abnormality. Prominent area of subpleural fat adjacent to the minor fissure. Hepatobiliary: No focal liver abnormality is seen. No  gallstones, gallbladder wall thickening, or biliary dilatation. Pancreas: Unremarkable. No pancreatic ductal dilatation or surrounding inflammatory changes. Spleen: Normal in size without focal abnormality. Adrenals/Urinary Tract: Adrenal glands are unremarkable. Kidneys are normal, without renal calculi, focal lesion, or hydronephrosis. Nonspecific bilateral perinephric stranding. Bladder is unremarkable. Stomach/Bowel: Small hiatal hernia. Otherwise normal stomach. Appendix appears normal. No evidence of bowel wall thickening, distention, or inflammatory changes. Diverticulosis of the sigmoid colon without evidence of diverticulitis. Vascular/Lymphatic: Normal caliber abdominal aorta with atherosclerosis. No lymphadenopathy. Reproductive: Prostate is unremarkable. Other: No abdominal wall hernia or abnormality. No abdominopelvic ascites. Musculoskeletal: No acute osseous abnormality. No aggressive osseous lesion. Degenerative disease with disc height loss at L4-5 and L5-S1. Bilateral facet arthropathy of the lumbar spine. IMPRESSION: 1. No acute abdominal or pelvic pathology. 2. Diverticulosis without diverticulitis. 3.  Aortic Atherosclerosis (ICD10-I70.0). Electronically Signed   By: Kathreen Devoid   On: 10/21/2017 15:14    Procedures Procedures (including critical care time)  Medications Ordered in ED Medications  sodium chloride 0.9 % injection (not administered)  0.9 %  sodium chloride infusion ( Intravenous Stopped 10/21/17 1536)  iopamidol (ISOVUE-300) 61 % injection (100 mLs  Contrast Given 10/21/17 1500)     Initial Impression / Assessment and Plan / ED Course  I have reviewed the triage vital signs and the nursing notes.  Pertinent labs & imaging results that were available during my care of the patient were reviewed by me and considered in my medical decision making (see chart for details).     Final Clinical Impressions(s) / ED Diagnoses   Final diagnoses:  Acute prostatitis  Patient  has had some rectal pressure and penile discomfort for approximately 2 weeks.  Urinalysis does not show acute infection.  Prostate exam showed mildly and diffusely enlarged prostate which was slightly tender.  No focal nodules appreciated.  CT scan ruled out diverticulitis.  No other acute inflammatory findings identified.  At this time with the combination of rectal discomfort and penile discomfort, will opt to treat for prostatitis.  Patient is clinically well in appearance.  He is counseled on follow-up plan.  ED Discharge Orders  Ordered    ciprofloxacin (CIPRO) 500 MG tablet  2 times daily     10/21/17 1547       Charlesetta Shanks, MD 10/21/17 1551

## 2017-10-21 NOTE — ED Triage Notes (Signed)
patient reports that he is having rectal discomfort-pressure x 1 1/2 weeks. Patient denies any rectal bleeding. Patient also reports that he is having burning and a slight ache in his penis when he urinates x 3-4 days.

## 2018-01-02 ENCOUNTER — Emergency Department (HOSPITAL_COMMUNITY): Payer: BLUE CROSS/BLUE SHIELD

## 2018-01-02 ENCOUNTER — Emergency Department (HOSPITAL_COMMUNITY)
Admission: EM | Admit: 2018-01-02 | Discharge: 2018-01-02 | Disposition: A | Discharge status: Home / Self Care | Payer: BLUE CROSS/BLUE SHIELD | Attending: Emergency Medicine | Admitting: Emergency Medicine

## 2018-01-02 ENCOUNTER — Encounter (HOSPITAL_COMMUNITY): Payer: Self-pay

## 2018-01-02 ENCOUNTER — Other Ambulatory Visit: Payer: Self-pay

## 2018-01-02 DIAGNOSIS — I10 Essential (primary) hypertension: Secondary | ICD-10-CM | POA: Diagnosis not present

## 2018-01-02 DIAGNOSIS — R531 Weakness: Secondary | ICD-10-CM

## 2018-01-02 DIAGNOSIS — Z7982 Long term (current) use of aspirin: Secondary | ICD-10-CM | POA: Insufficient documentation

## 2018-01-02 DIAGNOSIS — E119 Type 2 diabetes mellitus without complications: Secondary | ICD-10-CM | POA: Diagnosis not present

## 2018-01-02 DIAGNOSIS — R05 Cough: Secondary | ICD-10-CM | POA: Diagnosis present

## 2018-01-02 DIAGNOSIS — Z79899 Other long term (current) drug therapy: Secondary | ICD-10-CM | POA: Insufficient documentation

## 2018-01-02 DIAGNOSIS — M6281 Muscle weakness (generalized): Secondary | ICD-10-CM | POA: Diagnosis not present

## 2018-01-02 DIAGNOSIS — Z87891 Personal history of nicotine dependence: Secondary | ICD-10-CM | POA: Diagnosis not present

## 2018-01-02 LAB — CBC WITH DIFFERENTIAL/PLATELET
BASOS PCT: 0 %
Basophils Absolute: 0 10*3/uL (ref 0.0–0.1)
EOS ABS: 0 10*3/uL (ref 0.0–0.7)
Eosinophils Relative: 0 %
HCT: 39.4 % (ref 39.0–52.0)
HEMOGLOBIN: 13.5 g/dL (ref 13.0–17.0)
Lymphocytes Relative: 19 %
Lymphs Abs: 1.1 10*3/uL (ref 0.7–4.0)
MCH: 31.9 pg (ref 26.0–34.0)
MCHC: 34.3 g/dL (ref 30.0–36.0)
MCV: 93.1 fL (ref 78.0–100.0)
MONOS PCT: 11 %
Monocytes Absolute: 0.6 10*3/uL (ref 0.1–1.0)
NEUTROS PCT: 70 %
Neutro Abs: 3.9 10*3/uL (ref 1.7–7.7)
PLATELETS: 358 10*3/uL (ref 150–400)
RBC: 4.23 MIL/uL (ref 4.22–5.81)
RDW: 13.5 % (ref 11.5–15.5)
WBC: 5.7 10*3/uL (ref 4.0–10.5)

## 2018-01-02 LAB — URINALYSIS, ROUTINE W REFLEX MICROSCOPIC
Bilirubin Urine: NEGATIVE
Glucose, UA: NEGATIVE mg/dL
HGB URINE DIPSTICK: NEGATIVE
Ketones, ur: NEGATIVE mg/dL
Leukocytes, UA: NEGATIVE
Nitrite: NEGATIVE
PH: 5 (ref 5.0–8.0)
Protein, ur: NEGATIVE mg/dL
SPECIFIC GRAVITY, URINE: 1.006 (ref 1.005–1.030)

## 2018-01-02 LAB — COMPREHENSIVE METABOLIC PANEL
ALK PHOS: 49 U/L (ref 38–126)
ALT: 13 U/L — AB (ref 17–63)
AST: 14 U/L — AB (ref 15–41)
Albumin: 4.5 g/dL (ref 3.5–5.0)
Anion gap: 12 (ref 5–15)
BUN: 16 mg/dL (ref 6–20)
CALCIUM: 10 mg/dL (ref 8.9–10.3)
CO2: 24 mmol/L (ref 22–32)
CREATININE: 1.38 mg/dL — AB (ref 0.61–1.24)
Chloride: 100 mmol/L — ABNORMAL LOW (ref 101–111)
GFR calc non Af Amer: 51 mL/min — ABNORMAL LOW (ref >60)
GFR, EST AFRICAN AMERICAN: 60 mL/min — AB (ref >60)
Glucose, Bld: 116 mg/dL — ABNORMAL HIGH (ref 65–99)
Potassium: 4.2 mmol/L (ref 3.5–5.1)
Sodium: 136 mmol/L (ref 135–145)
Total Bilirubin: 0.7 mg/dL (ref 0.3–1.2)
Total Protein: 7.3 g/dL (ref 6.5–8.1)

## 2018-01-02 LAB — TSH: TSH: 1.139 u[IU]/mL (ref 0.350–4.500)

## 2018-01-02 LAB — TROPONIN I: Troponin I: <0.03 ng/mL (ref <0.03)

## 2018-01-02 NOTE — ED Provider Notes (Signed)
[facility] DEPT Provider Note   CSN: [id number] Arrival date & time: 01/02/18  1052     History   Chief Complaint Chief Complaint  Patient presents with  . Chest Pain  . Weakness    HPI Lee Christensen is a 67 y.o. male.  HPI Patient presents with multiple complaints.  Cough with clear sputum for 2 to 3 weeks.  Weakness shortness of breath weight loss fatigue anxiety groin pain.  Ulcerative come and gone over the last couple months.  Has been seen by his primary care doctor for without a clear cause.  Has had previous hernia repair.  States he has lost 10 to 15 pounds without trying.  Seen in the ER for somewhat similar symptoms and is been diagnosed with prostatitis a couple months ago.  Has been on Cipro and Bactrim and Cipro again through his urologist.  No dysuria.  States his Xanax has not been helping him sleep. Past Medical History:  Diagnosis Date  . Anemia   . Arthritis   . Bilateral inguinal hernia (BIH) s/p lap repair 12/29/2012 12/04/2012  . Chronic back pain   . Complication of anesthesia    slow to awaken after hernia surgery  . Diabetes mellitus without complication (HCC)    Borderline.  . Diverticulosis   . GERD (gastroesophageal reflux disease)   . Hemangioma of left chest wall s/p excision 12/29/2012 01/17/2013  . Hemangioma of left chest wall s/p excision 12/29/2012 01/17/2013  . Hypertension   . Knee pain     Patient Active Problem List   Diagnosis Date Noted  . Chronic groin pain - right 08/15/2013  . Bilateral inguinal hernia (BIH) s/p lap repair 12/29/2012 12/04/2012  . Umbilical hernia s/p repair 12/2012 12/04/2012  . Encounter for screening colonoscopy 10/31/2012  . Diverticulosis of sigmoid colon 10/31/2012    Past Surgical History:  Procedure Laterality Date  . CATARACT EXTRACTION W/ INTRAOCULAR LENS  IMPLANT, BILATERAL  1996  . COLONOSCOPY  04/03/01   ION:[id number] hemorrhoids otherwise normal  . COLONOSCOPY N/A 11/09/2012     Dr. Gala Romney- normal rectum, scattered left-sided diverticula, the remainder of colonic mucosa appeared normal.  . ESOPHAGOGASTRODUODENOSCOPY  06/25/03   LKG:MWNUUV colored tongue-NO BARRETTs (EROSIVE ESOPHAGITIS)/small HH  . HERNIA REPAIR     ? lower abd  . INSERTION OF MESH Bilateral 12/29/2012   Procedure: INSERTION OF MESH;  Surgeon: Adin Hector, MD;  Location: [facility];  Service: General;  Laterality: Bilateral;  . KNEE SURGERY     left x 2  . LAPAROSCOPIC INGUINAL HERNIA WITH UMBILICAL HERNIA Bilateral 12/29/2012   Procedure: LAPAROSCOPIC INGUINAL HERNIA WITH UMBILICAL HERNIA;  Surgeon: Adin Hector, MD;  Location: [facility];  Service: General;  Laterality: Bilateral;  . MASS EXCISION Left 12/29/2012   Procedure: EXCISION MASS LEFT CHEST WALL;  Surgeon: Adin Hector, MD;  Location: [facility];  Service: General;  Laterality: Left;  . WISDOM TOOTH EXTRACTION          Home Medications    Prior to Admission medications   Medication Sig Start Date End Date Taking? Authorizing Provider  ALPRAZolam Duanne Moron) 1 MG tablet Take 1 mg by mouth 4 (four) times daily as needed for anxiety.   Yes [provider]  aspirin 81 MG tablet Take 81 mg by mouth daily.   Yes [provider]  atorvastatin (LIPITOR) 20 MG tablet Take 20 mg by mouth daily. 10/12/17  Yes [provider]  Bisacodyl (DULCOLAX PO)  Take 3 tablets by mouth daily as needed (constipation).   Yes [provider]  ciprofloxacin (CIPRO) 500 MG tablet Take 1 tablet (500 mg total) by mouth 2 (two) times daily. One po bid x 7 days 10/21/17  Yes Pfeiffer, Jeannie Done, MD  co-enzyme Q-10 30 MG capsule Take 100 mg by mouth daily.   Yes [provider]  gabapentin (NEURONTIN) 300 MG capsule Take 300 mg by mouth 4 (four) times daily. 10/11/17  Yes [provider]  hydrochlorothiazide (HYDRODIURIL) 25 MG tablet Take 25 mg by mouth every morning.  08/11/12  Yes [provider]  irbesartan  (AVAPRO) 300 MG tablet Take 300 mg by mouth daily. 12/29/17  Yes [provider]  ondansetron (ZOFRAN) 4 MG tablet Take 4 mg by mouth every 6 (six) hours as needed for nausea/vomiting. 12/21/17  Yes [provider]  pantoprazole (PROTONIX) 40 MG tablet Take 40 mg by mouth daily as needed for heartburn. 10/02/17  Yes [provider]  Probiotic Product (ALIGN) 4 MG CAPS Take 4 mg by mouth daily.   Yes [provider]  tamsulosin (FLOMAX) 0.4 MG CAPS capsule Take 0.4 mg by mouth daily.  04/11/13  Yes [provider]  traZODone (DESYREL) 100 MG tablet Take 150 mg by mouth daily as needed for sleep. 12/02/17  Yes [provider]  triamcinolone cream (KENALOG) 0.1 % Apply 1 application topically at bedtime. 12/26/17  Yes [provider]  Wheat Dextrin (BENEFIBER PO) Take by mouth.   Yes [provider]  amitriptyline (ELAVIL) 25 MG tablet Start Elavil 25mg  PO QHS x 3 days, then 50mg  PO QHS. May increase up to a max of 75mg  QHS per Dr Johney Maine. Take this for 6 weeks. Patient not taking: Reported on 01/02/2018 09/06/13   Michael Boston, MD  methocarbamol (ROBAXIN) 750 MG tablet TAKE 1 TABLET (750 MG TOTAL) BY MOUTH 4 (FOUR) TIMES DAILY AS NEEDED (USE FOR MUSCLE CRAMPS/PAIN). Patient not taking: Reported on 01/02/2018    Michael Boston, MD  naproxen (NAPROSYN) 500 MG tablet TAKE 1 TABLET (500 MG TOTAL) BY MOUTH 2 (TWO) TIMES DAILY WITH A MEAL. Patient not taking: Reported on 01/02/2018    Michael Boston, MD    Family History Family History  Problem Relation Age of Onset  . Prostate cancer Father   . Diabetes Father   . Hypertension Father   . Stroke Father   . Lung cancer Mother     Social History Social History   Tobacco Use  . Smoking status: Former Smoker    Packs/day: 3.00    Years: 30.00    Pack years: 90.00    Types: Cigarettes    Last attempt to quit: 08/16/1996    Years since quitting: 21.3  . Smokeless tobacco: Former Systems developer     Types: Chew    Quit date: 11/01/1994  Substance Use Topics  . Alcohol use: Yes    Alcohol/week: 0.6 oz    Types: 1 Cans of beer per week    Comment: Rarely.  . Drug use: No     Allergies   Penicillins   Review of Systems Review of Systems  Constitutional: Positive for appetite change and unexpected weight change.  HENT: Negative for congestion.   Respiratory: Positive for cough.   Gastrointestinal: Negative for abdominal pain.  Endocrine: Negative for polyuria.  Genitourinary: Negative for frequency.  Musculoskeletal: Negative for back pain.  Skin: Negative for rash.  Neurological: Negative for tremors.  Hematological: Negative for adenopathy.  Psychiatric/Behavioral: The patient is nervous/anxious.      Physical Exam Updated Vital Signs BP (!) 115/58 (BP Location: Right Arm)   Pulse 65   Temp 98.3 F (36.8 C) (Oral)   Resp 15   Ht 5\' 6"  (1.676 m)   Wt (S) 66.3 kg (146 lb 1 oz)   SpO2 100%   BMI 23.58 kg/m   Physical Exam  Constitutional: He appears well-developed.  HENT:  Head: Atraumatic.  Eyes: Pupils are equal, round, and reactive to light.  Cardiovascular: Regular rhythm and normal pulses.  Pulmonary/Chest: Effort normal.  Abdominal: Soft.  Genitourinary:  Genitourinary Comments: No tenderness right testicle, no hernia palpated.  Musculoskeletal:       Right lower leg: He exhibits no edema.       Left lower leg: He exhibits no edema.  Neurological: He is alert.  Skin: Skin is warm. Capillary refill takes less than 2 seconds.     ED Treatments / Results  Labs (all labs ordered are listed, but only abnormal results are displayed) Labs Reviewed  COMPREHENSIVE METABOLIC PANEL - Abnormal; Notable for the following components:      Result Value   Chloride 100 (*)    Glucose, Bld 116 (*)    Creatinine, Ser 1.38 (*)    AST 14 (*)    ALT 13 (*)    GFR calc non Af Amer 51 (*)    GFR calc Af Amer 60 (*)    All other components within normal limits   URINALYSIS, ROUTINE W REFLEX MICROSCOPIC  CBC WITH DIFFERENTIAL/PLATELET  TSH  TROPONIN I    EKG EKG Interpretation  Date/Time:  Monday Jan 02 2018 11:00:13 EDT Ventricular Rate:  75 PR Interval:    QRS Duration: 86 QT Interval:  352 QTC Calculation: 394 R Axis:   10 Text Interpretation:  Sinus rhythm Atrial premature complex Low voltage, precordial leads Abnormal R-wave progression, early transition No old tracing to compare Confirmed by Davonna Belling [phone]) on 01/02/2018 11:31:53 AM   Radiology Dg Chest 2 View  Result Date: 01/02/2018 CLINICAL DATA:  Weakness, loss of appetite and shortness of breath for 1 month. EXAM: CHEST - 2 VIEW COMPARISON:  12/18/2012. FINDINGS: Trachea is midline. Heart size normal. There may be mild pleuroparenchymal scarring in the lateral right hemithorax. Lungs are otherwise clear. No pleural fluid. IMPRESSION: No acute findings. Electronically Signed   By: Lorin Picket M.D.   On: 01/02/2018 12:19    Procedures Procedures (including critical care time)  Medications Ordered in ED Medications - No data to display   Initial Impression / Assessment and Plan / ED Course  I have reviewed the triage vital signs and the nursing notes.  Pertinent labs & imaging results that were available during my care of the patient were reviewed by me and considered in my medical decision making (see chart for details).     Patient with multiple complaints.  Lab work overall reassuring but mild renal insufficiency.  Reassuring lactic acid.  Will discharge home to follow-up with PCP.  May be anxiety component to this.  Final Clinical Impressions(s) / ED Diagnoses   Final diagnoses:  Generalized weakness    ED Discharge Orders    None       Davonna Belling, MD 01/02/18 [phone]

## 2018-01-02 NOTE — ED Notes (Signed)
Patient transported to X-ray 

## 2018-01-02 NOTE — ED Triage Notes (Signed)
Patient c/o intermittent chest pain and a cough with clear sputum x 2-3 weeks.

## 2019-01-01 DIAGNOSIS — R102 Pelvic and perineal pain: Secondary | ICD-10-CM | POA: Diagnosis not present

## 2019-01-01 DIAGNOSIS — F419 Anxiety disorder, unspecified: Secondary | ICD-10-CM | POA: Diagnosis not present

## 2019-01-01 DIAGNOSIS — E7849 Other hyperlipidemia: Secondary | ICD-10-CM | POA: Diagnosis not present

## 2019-01-01 DIAGNOSIS — Z1389 Encounter for screening for other disorder: Secondary | ICD-10-CM | POA: Diagnosis not present

## 2019-01-01 DIAGNOSIS — Z6825 Body mass index (BMI) 25.0-25.9, adult: Secondary | ICD-10-CM | POA: Diagnosis not present

## 2019-01-01 DIAGNOSIS — K644 Residual hemorrhoidal skin tags: Secondary | ICD-10-CM | POA: Diagnosis not present

## 2019-01-01 DIAGNOSIS — K645 Perianal venous thrombosis: Secondary | ICD-10-CM | POA: Diagnosis not present

## 2019-01-01 DIAGNOSIS — N401 Enlarged prostate with lower urinary tract symptoms: Secondary | ICD-10-CM | POA: Diagnosis not present

## 2019-02-07 ENCOUNTER — Other Ambulatory Visit: Payer: Self-pay

## 2019-02-07 ENCOUNTER — Other Ambulatory Visit: Payer: Self-pay | Admitting: Internal Medicine

## 2019-02-07 DIAGNOSIS — R6889 Other general symptoms and signs: Secondary | ICD-10-CM | POA: Diagnosis not present

## 2019-02-07 DIAGNOSIS — Z20822 Contact with and (suspected) exposure to covid-19: Secondary | ICD-10-CM

## 2019-02-07 NOTE — Progress Notes (Unsigned)
[id number] 

## 2019-02-12 LAB — NOVEL CORONAVIRUS, NAA: SARS-CoV-2, NAA: NOT DETECTED

## 2019-02-20 ENCOUNTER — Telehealth: Payer: Self-pay | Admitting: Internal Medicine

## 2019-02-20 NOTE — Telephone Encounter (Signed)
Pt called in and gave him Neg Covid results.  He expressed understanding

## 2019-03-27 DIAGNOSIS — M2550 Pain in unspecified joint: Secondary | ICD-10-CM | POA: Diagnosis not present

## 2019-03-27 DIAGNOSIS — M170 Bilateral primary osteoarthritis of knee: Secondary | ICD-10-CM | POA: Diagnosis not present

## 2019-03-27 DIAGNOSIS — M1712 Unilateral primary osteoarthritis, left knee: Secondary | ICD-10-CM | POA: Diagnosis not present

## 2019-03-27 DIAGNOSIS — M1711 Unilateral primary osteoarthritis, right knee: Secondary | ICD-10-CM | POA: Diagnosis not present

## 2019-03-29 DIAGNOSIS — K219 Gastro-esophageal reflux disease without esophagitis: Secondary | ICD-10-CM | POA: Diagnosis not present

## 2019-03-29 DIAGNOSIS — I700 Atherosclerosis of aorta: Secondary | ICD-10-CM | POA: Diagnosis not present

## 2019-03-29 DIAGNOSIS — E748 Other specified disorders of carbohydrate metabolism: Secondary | ICD-10-CM | POA: Diagnosis not present

## 2019-03-29 DIAGNOSIS — Z6825 Body mass index (BMI) 25.0-25.9, adult: Secondary | ICD-10-CM | POA: Diagnosis not present

## 2019-03-29 DIAGNOSIS — I10 Essential (primary) hypertension: Secondary | ICD-10-CM | POA: Diagnosis not present

## 2019-03-30 DIAGNOSIS — M2550 Pain in unspecified joint: Secondary | ICD-10-CM | POA: Diagnosis not present

## 2019-04-03 DIAGNOSIS — M170 Bilateral primary osteoarthritis of knee: Secondary | ICD-10-CM | POA: Diagnosis not present

## 2019-04-03 DIAGNOSIS — M1712 Unilateral primary osteoarthritis, left knee: Secondary | ICD-10-CM | POA: Diagnosis not present

## 2019-04-10 DIAGNOSIS — M170 Bilateral primary osteoarthritis of knee: Secondary | ICD-10-CM | POA: Diagnosis not present

## 2019-07-24 DIAGNOSIS — F419 Anxiety disorder, unspecified: Secondary | ICD-10-CM | POA: Diagnosis not present

## 2019-09-18 ENCOUNTER — Other Ambulatory Visit: Payer: Self-pay | Admitting: Dermatology

## 2019-09-18 DIAGNOSIS — D045 Carcinoma in situ of skin of trunk: Secondary | ICD-10-CM | POA: Diagnosis not present

## 2019-09-18 DIAGNOSIS — D0462 Carcinoma in situ of skin of left upper limb, including shoulder: Secondary | ICD-10-CM | POA: Diagnosis not present

## 2019-09-18 DIAGNOSIS — L570 Actinic keratosis: Secondary | ICD-10-CM | POA: Diagnosis not present

## 2019-09-18 DIAGNOSIS — C4492 Squamous cell carcinoma of skin, unspecified: Secondary | ICD-10-CM

## 2019-09-18 DIAGNOSIS — D099 Carcinoma in situ, unspecified: Secondary | ICD-10-CM

## 2019-09-18 HISTORY — DX: Carcinoma in situ, unspecified: D09.9

## 2019-09-18 HISTORY — DX: Squamous cell carcinoma of skin, unspecified: C44.92

## 2019-09-28 DIAGNOSIS — Z0001 Encounter for general adult medical examination with abnormal findings: Secondary | ICD-10-CM | POA: Diagnosis not present

## 2019-09-28 DIAGNOSIS — F419 Anxiety disorder, unspecified: Secondary | ICD-10-CM | POA: Diagnosis not present

## 2019-09-28 DIAGNOSIS — E663 Overweight: Secondary | ICD-10-CM | POA: Diagnosis not present

## 2019-09-28 DIAGNOSIS — N400 Enlarged prostate without lower urinary tract symptoms: Secondary | ICD-10-CM | POA: Diagnosis not present

## 2019-09-28 DIAGNOSIS — Z6825 Body mass index (BMI) 25.0-25.9, adult: Secondary | ICD-10-CM | POA: Diagnosis not present

## 2019-09-28 DIAGNOSIS — E782 Mixed hyperlipidemia: Secondary | ICD-10-CM | POA: Diagnosis not present

## 2019-09-28 DIAGNOSIS — Z1389 Encounter for screening for other disorder: Secondary | ICD-10-CM | POA: Diagnosis not present

## 2019-09-28 DIAGNOSIS — I10 Essential (primary) hypertension: Secondary | ICD-10-CM | POA: Diagnosis not present

## 2019-10-25 ENCOUNTER — Other Ambulatory Visit: Payer: Self-pay | Admitting: Dermatology

## 2019-10-25 DIAGNOSIS — D0462 Carcinoma in situ of skin of left upper limb, including shoulder: Secondary | ICD-10-CM | POA: Diagnosis not present

## 2019-10-25 DIAGNOSIS — D044 Carcinoma in situ of skin of scalp and neck: Secondary | ICD-10-CM | POA: Diagnosis not present

## 2019-11-02 ENCOUNTER — Telehealth: Payer: Self-pay

## 2019-11-02 NOTE — Telephone Encounter (Signed)
Phone call to patient with his Pathology results and Dr. Onalee Hua recommendations.  Patient aware of Pathology results and appointment scheduled with Dr. Denna Haggard on 11/22/2019 @ 1:30pm

## 2019-11-22 ENCOUNTER — Ambulatory Visit (INDEPENDENT_AMBULATORY_CARE_PROVIDER_SITE_OTHER): Payer: Medicare HMO | Admitting: Dermatology

## 2019-11-22 ENCOUNTER — Other Ambulatory Visit: Payer: Self-pay

## 2019-11-22 ENCOUNTER — Encounter: Payer: Self-pay | Admitting: Dermatology

## 2019-11-22 DIAGNOSIS — D044 Carcinoma in situ of skin of scalp and neck: Secondary | ICD-10-CM | POA: Diagnosis not present

## 2019-11-22 DIAGNOSIS — L219 Seborrheic dermatitis, unspecified: Secondary | ICD-10-CM

## 2019-11-22 DIAGNOSIS — D099 Carcinoma in situ, unspecified: Secondary | ICD-10-CM

## 2019-11-22 NOTE — Patient Instructions (Addendum)
In addition to returning for treatment of to carcinoma in situ on his scalp, Lee Christensen-reported noting an recent months every morning waking up with some redness and scale and slight itching on the mid brow and the sides of the upper nose.  The diagnosis of seborrheic dermatitis was explained to him.  For the next 2 weeks, he will apply nightly some nonprescription 1% hydrocortisone ointment (not cream, told this might be behind the counter).  Follow-up relating to the surgical sites and the facial rash by phone at that time    .Biopsy, Surgery (Curettage) & Surgery (Excision) Aftercare Instructions  1. Okay to remove bandage in 24 hours  2. Wash area with soap and water  3. Apply Vaseline to area twice daily until healed (Not Neosporin)  4. Okay to cover with a Band-Aid to decrease the chance of infection or prevent irritation from clothing; also it's okay to uncover lesion at home.  5. Suture instructions: return to our office in 7-10 or 10-14 days for a nurse visit for suture removal. Variable healing with sutures, if pain or itching occurs call our office. It's okay to shower or bathe 24 hours after sutures are given.  6. The following risks may occur after a biopsy, curettage or excision: bleeding, scarring, discoloration, recurrence, infection (redness, yellow drainage, pain or swelling).  7. For questions, concerns and results call our office at [place] before 4pm & Friday before 3pm. Biopsy results will be available in 1 week.

## 2019-11-29 DIAGNOSIS — M1711 Unilateral primary osteoarthritis, right knee: Secondary | ICD-10-CM | POA: Diagnosis not present

## 2019-11-29 DIAGNOSIS — M1712 Unilateral primary osteoarthritis, left knee: Secondary | ICD-10-CM | POA: Diagnosis not present

## 2019-11-29 DIAGNOSIS — M170 Bilateral primary osteoarthritis of knee: Secondary | ICD-10-CM | POA: Diagnosis not present

## 2019-11-30 ENCOUNTER — Encounter: Payer: Self-pay | Admitting: Dermatology

## 2019-11-30 NOTE — Progress Notes (Addendum)
   Follow-Up Visit   Subjective  Lee Christensen is a 69 y.o. male who presents for the following: Procedure (Here for tretment on mid scalp and front mid scalp CIS x 2).  CIS Location: Scalp x2 Duration: Biopsies in March 2021 Quality:  Associated Signs/Symptoms: Modifying Factors:  Severity:  Timing: Context: For treatment  The following portions of the chart were reviewed this encounter and updated as appropriate: Tobacco  Allergies  Meds  Problems  Med Hx  Surg Hx  Fam Hx      Objective  Well appearing patient in no apparent distress; mood and affect are within normal limits.  Focused skin examination, head, neck, and arms   Assessment & Plan  Seborrheic dermatitis (3) Mid Forehead; Left Alar Crease; Right Alar Crease  1% HC ointment (non-Rx)  Squamous cell carcinoma in situ (2) Mid Parietal Scalp - front  Destruction of lesion Complexity: simple   Destruction method: electrodesiccation and curettage   Informed consent: discussed and consent obtained   Timeout:  patient name, date of birth, surgical site, and procedure verified Anesthesia: the lesion was anesthetized in a standard fashion   Anesthetic:  1% lidocaine w/ epinephrine 1-100,000 local infiltration Curettage performed in three different directions: Yes   Curettage cycles:  3 Lesion length (cm):  1 Lesion width (cm):  1 Margin per side (cm):  0 Final wound size (cm):  1 Hemostasis achieved with:  ferric subsulfate Outcome: patient tolerated procedure well with no complications   Post-procedure details: wound care instructions given   Additional details:  Inoculated with parenteral 5% fluorouracil  Mid Parietal Scalp  Destruction of lesion Complexity: simple   Destruction method: electrodesiccation and curettage   Informed consent: discussed and consent obtained   Timeout:  patient name, date of birth, surgical site, and procedure verified Anesthesia: the lesion was anesthetized in a standard  fashion   Anesthetic:  1% lidocaine w/ epinephrine 1-100,000 local infiltration Curettage performed in three different directions: Yes   Curettage cycles:  3 Lesion length (cm):  1.2 Lesion width (cm):  1.2 Margin per side (cm):  0 Final wound size (cm):  1.2 Hemostasis achieved with:  ferric subsulfate Outcome: patient tolerated procedure well with no complications   Post-procedure details: wound care instructions given   Additional details:  Inoculated with parenteral 5% fluorouracil  Each biopsy site curet x3, wide>deep, front 1.0cm, mid-scalp 1.2cm; bases innoculated with parenteral 5FU. In addition to returning for treatment of to carcinoma in situ on his scalp, Lee Christensen-reported noting an recent months every morning waking up with some redness and scale and slight itching on the mid brow and the sides of the upper nose.  The diagnosis of seborrheic dermatitis was explained to him.  For the next 2 weeks, he will apply nightly some nonprescription 1% hydrocortisone ointment (not cream, told this might be behind the counter).  Follow-up relating to the surgical sites and the facial rash by phone at that time

## 2019-12-06 DIAGNOSIS — M170 Bilateral primary osteoarthritis of knee: Secondary | ICD-10-CM | POA: Diagnosis not present

## 2019-12-06 DIAGNOSIS — M1712 Unilateral primary osteoarthritis, left knee: Secondary | ICD-10-CM | POA: Diagnosis not present

## 2019-12-06 DIAGNOSIS — M1711 Unilateral primary osteoarthritis, right knee: Secondary | ICD-10-CM | POA: Diagnosis not present

## 2019-12-13 DIAGNOSIS — M170 Bilateral primary osteoarthritis of knee: Secondary | ICD-10-CM | POA: Diagnosis not present

## 2019-12-14 DIAGNOSIS — F4542 Pain disorder with related psychological factors: Secondary | ICD-10-CM | POA: Diagnosis not present

## 2019-12-14 DIAGNOSIS — I10 Essential (primary) hypertension: Secondary | ICD-10-CM | POA: Diagnosis not present

## 2019-12-14 DIAGNOSIS — N400 Enlarged prostate without lower urinary tract symptoms: Secondary | ICD-10-CM | POA: Diagnosis not present

## 2019-12-14 DIAGNOSIS — G894 Chronic pain syndrome: Secondary | ICD-10-CM | POA: Diagnosis not present

## 2019-12-27 ENCOUNTER — Encounter: Payer: Self-pay | Admitting: *Deleted

## 2019-12-27 DIAGNOSIS — N3281 Overactive bladder: Secondary | ICD-10-CM | POA: Diagnosis not present

## 2019-12-27 DIAGNOSIS — N411 Chronic prostatitis: Secondary | ICD-10-CM | POA: Diagnosis not present

## 2019-12-27 DIAGNOSIS — K219 Gastro-esophageal reflux disease without esophagitis: Secondary | ICD-10-CM | POA: Diagnosis not present

## 2019-12-27 DIAGNOSIS — I10 Essential (primary) hypertension: Secondary | ICD-10-CM | POA: Diagnosis not present

## 2019-12-27 DIAGNOSIS — Z6824 Body mass index (BMI) 24.0-24.9, adult: Secondary | ICD-10-CM | POA: Diagnosis not present

## 2019-12-27 DIAGNOSIS — M1991 Primary osteoarthritis, unspecified site: Secondary | ICD-10-CM | POA: Diagnosis not present

## 2019-12-27 DIAGNOSIS — R350 Frequency of micturition: Secondary | ICD-10-CM | POA: Diagnosis not present

## 2020-01-01 ENCOUNTER — Encounter: Payer: Self-pay | Admitting: Dermatology

## 2020-01-01 ENCOUNTER — Ambulatory Visit: Payer: Medicare HMO | Admitting: Dermatology

## 2020-01-01 ENCOUNTER — Other Ambulatory Visit: Payer: Self-pay

## 2020-01-01 DIAGNOSIS — D225 Melanocytic nevi of trunk: Secondary | ICD-10-CM | POA: Diagnosis not present

## 2020-01-01 DIAGNOSIS — Z85828 Personal history of other malignant neoplasm of skin: Secondary | ICD-10-CM

## 2020-01-01 DIAGNOSIS — L570 Actinic keratosis: Secondary | ICD-10-CM

## 2020-01-01 DIAGNOSIS — D229 Melanocytic nevi, unspecified: Secondary | ICD-10-CM

## 2020-01-01 MED ORDER — TRIAMCINOLONE ACETONIDE 0.1 % EX CREA: 1.0000 "application " | TOPICAL_CREAM | Freq: Every day | CUTANEOUS | 3 refills | Status: DC

## 2020-01-06 ENCOUNTER — Encounter: Payer: Self-pay | Admitting: Dermatology

## 2020-01-06 NOTE — Progress Notes (Addendum)
   Follow-Up Visit   Subjective  Lee Christensen is a 69 y.o. male who presents for the following: Skin Problem (He has new places on his scalp that need attention. Itching and flaking. No bleeding.).  Crusts Location: Scalp Duration: Months Quality: Increased number Associated Signs/Symptoms: Modifying Factors:  Severity:  Timing: Context: History of multiple nonmelanoma skin cancers  The following portions of the chart were reviewed this encounter and updated as appropriate: Tobacco  Allergies  Meds  Problems  Med Hx  Surg Hx  Fam Hx      Objective  Well appearing patient in no apparent distress; mood and affect are within normal limits.  All skin waist up examined.   Assessment & Plan  AK (actinic keratosis) (5) Mid Parietal Scalp  Destruction of lesion - Mid Parietal Scalp (5)  Destruction method: cryotherapy   Informed consent: discussed and consent obtained   Timeout:  patient name, date of birth, surgical site, and procedure verified Lesion destroyed using liquid nitrogen: Yes   Region frozen until ice ball extended beyond lesion: Yes   Cryotherapy cycles:  5 Outcome: patient tolerated procedure well with no complications    Other Related Procedures Photodynamic therapy  Additional details:  Schedule in the late fall for his diffuse actinic keratoses  Nevus Mid Back  Personal history of skin cancer Head - Anterior (Face)  Annual skin examination

## 2020-02-13 DIAGNOSIS — I10 Essential (primary) hypertension: Secondary | ICD-10-CM | POA: Diagnosis not present

## 2020-02-13 DIAGNOSIS — F4542 Pain disorder with related psychological factors: Secondary | ICD-10-CM | POA: Diagnosis not present

## 2020-02-13 DIAGNOSIS — N400 Enlarged prostate without lower urinary tract symptoms: Secondary | ICD-10-CM | POA: Diagnosis not present

## 2020-02-13 DIAGNOSIS — G894 Chronic pain syndrome: Secondary | ICD-10-CM | POA: Diagnosis not present

## 2020-03-05 DIAGNOSIS — M65331 Trigger finger, right middle finger: Secondary | ICD-10-CM | POA: Diagnosis not present

## 2020-03-05 DIAGNOSIS — M180 Bilateral primary osteoarthritis of first carpometacarpal joints: Secondary | ICD-10-CM | POA: Diagnosis not present

## 2020-03-05 DIAGNOSIS — G5603 Carpal tunnel syndrome, bilateral upper limbs: Secondary | ICD-10-CM | POA: Diagnosis not present

## 2020-03-05 DIAGNOSIS — M79641 Pain in right hand: Secondary | ICD-10-CM | POA: Diagnosis not present

## 2020-03-05 DIAGNOSIS — G5601 Carpal tunnel syndrome, right upper limb: Secondary | ICD-10-CM | POA: Diagnosis not present

## 2020-03-05 DIAGNOSIS — M79642 Pain in left hand: Secondary | ICD-10-CM | POA: Diagnosis not present

## 2020-03-05 DIAGNOSIS — G5602 Carpal tunnel syndrome, left upper limb: Secondary | ICD-10-CM | POA: Diagnosis not present

## 2020-03-28 DIAGNOSIS — I10 Essential (primary) hypertension: Secondary | ICD-10-CM | POA: Diagnosis not present

## 2020-03-28 DIAGNOSIS — R109 Unspecified abdominal pain: Secondary | ICD-10-CM | POA: Diagnosis not present

## 2020-03-28 DIAGNOSIS — N400 Enlarged prostate without lower urinary tract symptoms: Secondary | ICD-10-CM | POA: Diagnosis not present

## 2020-03-28 DIAGNOSIS — R634 Abnormal weight loss: Secondary | ICD-10-CM | POA: Diagnosis not present

## 2020-03-28 DIAGNOSIS — Z6823 Body mass index (BMI) 23.0-23.9, adult: Secondary | ICD-10-CM | POA: Diagnosis not present

## 2020-03-28 DIAGNOSIS — M1991 Primary osteoarthritis, unspecified site: Secondary | ICD-10-CM | POA: Diagnosis not present

## 2020-03-28 DIAGNOSIS — G894 Chronic pain syndrome: Secondary | ICD-10-CM | POA: Diagnosis not present

## 2020-03-28 DIAGNOSIS — I700 Atherosclerosis of aorta: Secondary | ICD-10-CM | POA: Diagnosis not present

## 2020-03-28 DIAGNOSIS — K219 Gastro-esophageal reflux disease without esophagitis: Secondary | ICD-10-CM | POA: Diagnosis not present

## 2020-04-11 ENCOUNTER — Other Ambulatory Visit: Payer: Self-pay | Admitting: Dermatology

## 2020-04-16 DIAGNOSIS — M180 Bilateral primary osteoarthritis of first carpometacarpal joints: Secondary | ICD-10-CM | POA: Diagnosis not present

## 2020-04-16 DIAGNOSIS — M65331 Trigger finger, right middle finger: Secondary | ICD-10-CM | POA: Diagnosis not present

## 2020-04-16 DIAGNOSIS — G5603 Carpal tunnel syndrome, bilateral upper limbs: Secondary | ICD-10-CM | POA: Diagnosis not present

## 2020-05-15 DIAGNOSIS — K219 Gastro-esophageal reflux disease without esophagitis: Secondary | ICD-10-CM | POA: Diagnosis not present

## 2020-05-15 DIAGNOSIS — I10 Essential (primary) hypertension: Secondary | ICD-10-CM | POA: Diagnosis not present

## 2020-05-15 DIAGNOSIS — G894 Chronic pain syndrome: Secondary | ICD-10-CM | POA: Diagnosis not present

## 2020-05-15 DIAGNOSIS — M1991 Primary osteoarthritis, unspecified site: Secondary | ICD-10-CM | POA: Diagnosis not present

## 2020-05-27 ENCOUNTER — Ambulatory Visit: Payer: Medicare HMO | Admitting: Dermatology

## 2020-06-14 DIAGNOSIS — G894 Chronic pain syndrome: Secondary | ICD-10-CM | POA: Diagnosis not present

## 2020-06-14 DIAGNOSIS — M1991 Primary osteoarthritis, unspecified site: Secondary | ICD-10-CM | POA: Diagnosis not present

## 2020-06-14 DIAGNOSIS — I10 Essential (primary) hypertension: Secondary | ICD-10-CM | POA: Diagnosis not present

## 2020-06-14 DIAGNOSIS — K219 Gastro-esophageal reflux disease without esophagitis: Secondary | ICD-10-CM | POA: Diagnosis not present

## 2020-06-24 ENCOUNTER — Other Ambulatory Visit: Payer: Self-pay

## 2020-06-24 ENCOUNTER — Ambulatory Visit: Payer: Medicare HMO | Admitting: Internal Medicine

## 2020-06-24 ENCOUNTER — Encounter: Payer: Self-pay | Admitting: Internal Medicine

## 2020-06-24 VITALS — BP 108/63 | HR 91 | Temp 97.3°F | Ht 67.0 in | Wt 143.8 lb

## 2020-06-24 DIAGNOSIS — K219 Gastro-esophageal reflux disease without esophagitis: Secondary | ICD-10-CM | POA: Diagnosis not present

## 2020-06-24 DIAGNOSIS — R1319 Other dysphagia: Secondary | ICD-10-CM | POA: Diagnosis not present

## 2020-06-24 MED ORDER — PEG 3350-KCL-NA BICARB-NACL 420 G PO SOLR: 4000.0000 mL | ORAL | 0 refills | Status: AC

## 2020-06-24 NOTE — Patient Instructions (Addendum)
Schedule an EGD with esophageal dilation (dysphagia, worsening GERD) and screening colonoscopy (ASA 2)  Continue protonix 40 mg twice daily for now  Further recommendations to follow

## 2020-06-24 NOTE — Progress Notes (Signed)
Primary Care Physician:  Redmond School, MD Primary Gastroenterologist:  Dr. Gala Romney  Pre-Procedure History & Physical: HPI:  Lee Christensen is a 69 y.o. male here for further evaluation of worsening reflux symptoms over the past several weeks.  Recently had blood work done at [facility].  It sounds like H. pylori serologies were positive.  He took a combination of metronidazole and doxycycline for 9 or 10 days and had to stop because it made him sick.  Since that time,  his reflux symptoms have worsened.  He also describes esophageal dysphagia.  Has taken Naprosyn recently. Has been on Protonix 40 mg twice daily for a few years.  Has not had any melena or rectal bleeding.  Has intermittent diffuse abdominal pain.  Chronic bilateral inguinal pain after bilateral inguinal herniorrhaphies.  He is felt to have a nerve entrapment. Screening colonoscopy 2014-left-sided diverticulosis.  Patient told me today that if he has to have anything done he wants to have both upper and lower endoscopic evaluation at the same time so we will have to come back.  Wants updated CRC screening.  Past Medical History:  Diagnosis Date  . Anemia   . Arthritis   . Bilateral inguinal hernia (BIH) s/p lap repair 12/29/2012 12/04/2012  . Chronic back pain   . Complication of anesthesia    slow to awaken after hernia surgery  . Diabetes mellitus without complication (HCC)    Borderline.  . Diverticulosis   . GERD (gastroesophageal reflux disease)   . Hemangioma of left chest wall s/p excision 12/29/2012 01/17/2013  . Hemangioma of left chest wall s/p excision 12/29/2012 01/17/2013  . Hypertension   . Knee pain   . Squamous cell carcinoma in situ (SCCIS) 09/18/2019   Left Shoulder (curet and 5FU)  . Squamous cell carcinoma in situ (SCCIS) 10/25/2019   Left Post Shoulder (treatment after biopsy)  . Squamous cell carcinoma in situ (SCCIS) 10/25/2019   Medial Scalp  . Squamous cell carcinoma in situ (SCCIS) 10/25/2019   Front Mid  Scalp  . Squamous cell carcinoma of skin 10/25/2019   in situ-left post shoulder (txpbx), in situ-medial scalp- (CX35FU), in situ-front mid scalp- (CX35FU)  . Squamous cell carcinoma of skin 09/18/2019   in situ-Left shoulder-(CX35FU)    Past Surgical History:  Procedure Laterality Date  . CATARACT EXTRACTION W/ INTRAOCULAR LENS  IMPLANT, BILATERAL  1996  . COLONOSCOPY  04/03/01   [facility]:HENIDPOE hemorrhoids otherwise normal  . COLONOSCOPY N/A 11/09/2012   Dr. Gala Romney- normal rectum, scattered left-sided diverticula, the remainder of colonic mucosa appeared normal.  . ESOPHAGOGASTRODUODENOSCOPY  06/25/03   [facility]:NTIRWE colored tongue-NO BARRETTs (EROSIVE ESOPHAGITIS)/small HH  . HERNIA REPAIR     ? lower abd  . INSERTION OF MESH Bilateral 12/29/2012   Procedure: INSERTION OF MESH;  Surgeon: Adin Hector, MD;  Location: [facility];  Service: General;  Laterality: Bilateral;  . KNEE SURGERY     left x 2  . LAPAROSCOPIC INGUINAL HERNIA WITH UMBILICAL HERNIA Bilateral 12/29/2012   Procedure: LAPAROSCOPIC INGUINAL HERNIA WITH UMBILICAL HERNIA;  Surgeon: Adin Hector, MD;  Location: [facility];  Service: General;  Laterality: Bilateral;  . MASS EXCISION Left 12/29/2012   Procedure: EXCISION MASS LEFT CHEST WALL;  Surgeon: Adin Hector, MD;  Location: [facility];  Service: General;  Laterality: Left;  . WISDOM TOOTH EXTRACTION      Prior to Admission medications   Medication Sig Start Date End Date Taking? Authorizing Provider  amLODipine (NORVASC) 5  MG tablet amlodipine 5 mg tablet    [provider]  aspirin 81 MG tablet Take 81 mg by mouth daily.    [provider]  atorvastatin (LIPITOR) 20 MG tablet Take 20 mg by mouth daily. 10/12/17   [provider]  diclofenac Sodium (VOLTAREN) 1 % GEL  12/27/19   [provider]  Fluorouracil (TOLAK) 4 % CREA [place] 4 % topical cream    [provider]  gabapentin (NEURONTIN) 100 MG capsule  10/25/19   [provider]  gabapentin (NEURONTIN) 600 MG tablet  11/08/19   [provider]  hydrochlorothiazide (HYDRODIURIL) 25 MG tablet Take 25 mg by mouth every morning.  08/11/12   [provider]  naproxen (NAPROSYN) 500 MG tablet TAKE 1 TABLET (500 MG TOTAL) BY MOUTH 2 (TWO) TIMES DAILY WITH A MEAL.    Michael Boston, MD  oxybutynin (DITROPAN) 5 MG tablet  12/27/19   [provider]  Probiotic Product (ALIGN) 4 MG CAPS Take 4 mg by mouth daily.    [provider]  tamsulosin (FLOMAX) 0.4 MG CAPS capsule Take 0.4 mg by mouth daily.  04/11/13   [provider]  terazosin (HYTRIN) 2 MG capsule terazosin 2 mg capsule    [provider]  triamcinolone cream (KENALOG) 0.1 % APPLY TOPICALLY TO THE AFFECTED AREA AT BEDTIME 04/11/20   Lavonna Monarch, MD  Wheat Dextrin (BENEFIBER PO) Take by mouth.    [provider]    Allergies as of 06/24/2020 - Review Complete 01/06/2020  Allergen Reaction Noted  . Penicillins Rash 10/31/2012    Family History  Problem Relation Age of Onset  . Prostate cancer Father   . Diabetes Father   . Hypertension Father   . Stroke Father   . Lung cancer Mother     Social History   Socioeconomic History  . Marital status: Married    Spouse name: Not on file  . Number of children: 2  . Years of education: Not on file  . Highest education level: Not on file  Occupational History  . Occupation: retired; [employer]  . Smoking status: Former Smoker    Packs/day: 3.00    Years: 30.00    Pack years: 90.00    Types: Cigarettes    Quit date: 08/16/1996    Years since quitting: 23.8  . Smokeless tobacco: Former Systems developer    Types: Chew    Quit date: 11/01/1994  Vaping Use  . Vaping Use: Never used  Substance and Sexual Activity  . Alcohol use: Yes    Alcohol/week: 1.0 standard drink    Types: 1 Cans of beer per week    Comment: Rarely.  . Drug use: No  . Sexual activity: Not on file  Other  Topics Concern  . Not on file  Social History Narrative   Lives w/ wife   Social Determinants of Health   Financial Resource Strain:   . Difficulty of Paying Living Expenses: Not on file  Food Insecurity:   . Worried About Charity fundraiser in the Last Year: Not on file  . Ran Out of Food in the Last Year: Not on file  Transportation Needs:   . Lack of Transportation (Medical): Not on file  . Lack of Transportation (Non-Medical): Not on file  Physical Activity:   . Days of Exercise per Week: Not on file  . Minutes of Exercise per Session: Not on file  Stress:   .  Feeling of Stress : Not on file  Social Connections:   . Frequency of Communication with Friends and Family: Not on file  . Frequency of Social Gatherings with Friends and Family: Not on file  . Attends Religious Services: Not on file  . Active Member of Clubs or Organizations: Not on file  . Attends Archivist Meetings: Not on file  . Marital Status: Not on file  Intimate Partner Violence:   . Fear of Current or Ex-Partner: Not on file  . Emotionally Abused: Not on file  . Physically Abused: Not on file  . Sexually Abused: Not on file    Review of Systems: See HPI, otherwise negative ROS  Physical Exam: There were no vitals taken for this visit. General:   Alert,  Well-developed, well-nourished, pleasant and cooperative in NAD Neck:  Supple; no masses or thyromegaly. No significant cervical adenopathy. Lungs:  Clear throughout to auscultation.   No wheezes, crackles, or rhonchi. No acute distress. Heart:  Regular rate and rhythm; no murmurs, clicks, rubs,  or gallops. Abdomen: Non-distended, normal bowel sounds.  Soft and nontender without appreciable mass or hepatosplenomegaly.  Pulses:  Normal pulses noted. Extremities:  Without clubbing or edema.  Impression/Plan: Pleasant 69 year old gentleman with longstanding GERD (history reflux esophagitis) ,worsening of symptoms and esophageal dysphagia  recently in spite of taking twice daily PPI therapy.  Sounds like he was recently treated for H. Pylori.  It is possible that he has, indeed,  experienced a worsening of his reflux symptoms after H. pylori has been treated.  Chronic infection with H. pylori produces a relatively achlorhydric state.  Once H. pylori is eradicated,  gastric acid production typically increases.  There are multiple reports of acid reflux worsening after H. pylori eradication. Concomitant NSAID use is also of concern.  At this point, he really needs to have an EGD with possible esophageal dilation to further evaluate his symptoms. Would be just a little early to bring him back for screening colonoscopy but he is adamant he wants to have this done at the same time.  This is not unreasonable.  Recommendations: Schedule an EGD with esophageal dilation (dysphagia, worsening GERD) and screening colonoscopy (ASA 2).  The risks, benefits, limitations, imponderables and alternatives regarding both EGD and colonoscopy have been reviewed with the patient. Questions have been answered. All parties agreeable.   Continue protonix 40 mg twice daily for now  Further recommendations to follow       Notice: This dictation was prepared with Dragon dictation along with smaller phrase technology. Any transcriptional errors that result from this process are unintentional and may not be corrected upon review.

## 2020-06-24 NOTE — Patient Instructions (Signed)
PA for TCS/EGD/DIL submitted via [employer] website. Case approved. [employer]# [id number], valid 07/16/20-08/15/20.

## 2020-06-26 ENCOUNTER — Other Ambulatory Visit: Payer: Self-pay

## 2020-06-26 ENCOUNTER — Ambulatory Visit (INDEPENDENT_AMBULATORY_CARE_PROVIDER_SITE_OTHER): Payer: Medicare HMO | Admitting: *Deleted

## 2020-06-26 DIAGNOSIS — L570 Actinic keratosis: Secondary | ICD-10-CM | POA: Diagnosis not present

## 2020-06-26 MED ORDER — AMINOLEVULINIC ACID HCL 10 % EX GEL
2000.0000 mg | Freq: Once | CUTANEOUS | Status: AC
  Administered 2020-06-26: 2000 mg via TOPICAL

## 2020-06-26 NOTE — Progress Notes (Signed)
Patient here for PDT- scalp 90 minutes, red light & debridement done by Dr Denna Haggard

## 2020-06-26 NOTE — Patient Instructions (Signed)

## 2020-06-27 ENCOUNTER — Ambulatory Visit: Payer: Medicare HMO

## 2020-06-27 ENCOUNTER — Telehealth: Payer: Self-pay | Admitting: Dermatology

## 2020-06-27 NOTE — Telephone Encounter (Signed)
Patient left message on office voice mail that he would like to speak with someone about a problem he is having following the PDT treatment he had done yesterday, 06/26/2020.

## 2020-06-27 NOTE — Telephone Encounter (Signed)
Phone call to patient he wanted to know if he should keep using the Triamcinolone cream on scalp after his red light treatment. I told him no he needs to let his scalp heal and that we will let Dr.Tafeen re evaluate at his follow up appointment.

## 2020-06-30 DIAGNOSIS — G4700 Insomnia, unspecified: Secondary | ICD-10-CM | POA: Diagnosis not present

## 2020-06-30 DIAGNOSIS — F419 Anxiety disorder, unspecified: Secondary | ICD-10-CM | POA: Diagnosis not present

## 2020-06-30 DIAGNOSIS — I10 Essential (primary) hypertension: Secondary | ICD-10-CM | POA: Diagnosis not present

## 2020-06-30 DIAGNOSIS — Z6822 Body mass index (BMI) 22.0-22.9, adult: Secondary | ICD-10-CM | POA: Diagnosis not present

## 2020-06-30 DIAGNOSIS — R351 Nocturia: Secondary | ICD-10-CM | POA: Diagnosis not present

## 2020-07-11 ENCOUNTER — Other Ambulatory Visit: Payer: Self-pay | Admitting: Dermatology

## 2020-07-12 ENCOUNTER — Emergency Department (HOSPITAL_COMMUNITY): Payer: Medicare HMO

## 2020-07-12 ENCOUNTER — Other Ambulatory Visit: Payer: Self-pay

## 2020-07-12 ENCOUNTER — Observation Stay (HOSPITAL_COMMUNITY): Payer: Medicare HMO

## 2020-07-12 ENCOUNTER — Encounter (HOSPITAL_COMMUNITY): Payer: Self-pay | Admitting: Internal Medicine

## 2020-07-12 ENCOUNTER — Inpatient Hospital Stay (HOSPITAL_COMMUNITY)
Admission: EM | Admit: 2020-07-12 | Discharge: 2020-07-16 | DRG: 436 | Disposition: A | Discharge status: Home / Self Care | Payer: Medicare HMO | Attending: Internal Medicine | Admitting: Internal Medicine

## 2020-07-12 DIAGNOSIS — R52 Pain, unspecified: Secondary | ICD-10-CM | POA: Diagnosis not present

## 2020-07-12 DIAGNOSIS — D509 Iron deficiency anemia, unspecified: Secondary | ICD-10-CM | POA: Diagnosis present

## 2020-07-12 DIAGNOSIS — R1319 Other dysphagia: Secondary | ICD-10-CM

## 2020-07-12 DIAGNOSIS — B370 Candidal stomatitis: Secondary | ICD-10-CM | POA: Diagnosis not present

## 2020-07-12 DIAGNOSIS — R918 Other nonspecific abnormal finding of lung field: Secondary | ICD-10-CM

## 2020-07-12 DIAGNOSIS — K449 Diaphragmatic hernia without obstruction or gangrene: Secondary | ICD-10-CM | POA: Diagnosis present

## 2020-07-12 DIAGNOSIS — K140 Glossitis: Secondary | ICD-10-CM | POA: Diagnosis present

## 2020-07-12 DIAGNOSIS — Z823 Family history of stroke: Secondary | ICD-10-CM

## 2020-07-12 DIAGNOSIS — F40240 Claustrophobia: Secondary | ICD-10-CM | POA: Diagnosis present

## 2020-07-12 DIAGNOSIS — C3490 Malignant neoplasm of unspecified part of unspecified bronchus or lung: Secondary | ICD-10-CM

## 2020-07-12 DIAGNOSIS — Z515 Encounter for palliative care: Secondary | ICD-10-CM

## 2020-07-12 DIAGNOSIS — K7689 Other specified diseases of liver: Secondary | ICD-10-CM | POA: Diagnosis not present

## 2020-07-12 DIAGNOSIS — Z77090 Contact with and (suspected) exposure to asbestos: Secondary | ICD-10-CM | POA: Diagnosis present

## 2020-07-12 DIAGNOSIS — F419 Anxiety disorder, unspecified: Secondary | ICD-10-CM | POA: Diagnosis present

## 2020-07-12 DIAGNOSIS — G4700 Insomnia, unspecified: Secondary | ICD-10-CM | POA: Diagnosis present

## 2020-07-12 DIAGNOSIS — R9431 Abnormal electrocardiogram [ECG] [EKG]: Secondary | ICD-10-CM | POA: Diagnosis not present

## 2020-07-12 DIAGNOSIS — Z880 Allergy status to penicillin: Secondary | ICD-10-CM

## 2020-07-12 DIAGNOSIS — N179 Acute kidney failure, unspecified: Secondary | ICD-10-CM | POA: Diagnosis not present

## 2020-07-12 DIAGNOSIS — E860 Dehydration: Secondary | ICD-10-CM | POA: Diagnosis not present

## 2020-07-12 DIAGNOSIS — C799 Secondary malignant neoplasm of unspecified site: Secondary | ICD-10-CM

## 2020-07-12 DIAGNOSIS — K219 Gastro-esophageal reflux disease without esophagitis: Secondary | ICD-10-CM | POA: Diagnosis present

## 2020-07-12 DIAGNOSIS — Z79899 Other long term (current) drug therapy: Secondary | ICD-10-CM

## 2020-07-12 DIAGNOSIS — Z85828 Personal history of other malignant neoplasm of skin: Secondary | ICD-10-CM

## 2020-07-12 DIAGNOSIS — Z8249 Family history of ischemic heart disease and other diseases of the circulatory system: Secondary | ICD-10-CM

## 2020-07-12 DIAGNOSIS — K5750 Diverticulosis of both small and large intestine without perforation or abscess without bleeding: Secondary | ICD-10-CM | POA: Diagnosis present

## 2020-07-12 DIAGNOSIS — R160 Hepatomegaly, not elsewhere classified: Secondary | ICD-10-CM | POA: Diagnosis not present

## 2020-07-12 DIAGNOSIS — K222 Esophageal obstruction: Secondary | ICD-10-CM | POA: Diagnosis present

## 2020-07-12 DIAGNOSIS — Z7189 Other specified counseling: Secondary | ICD-10-CM

## 2020-07-12 DIAGNOSIS — E119 Type 2 diabetes mellitus without complications: Secondary | ICD-10-CM | POA: Diagnosis present

## 2020-07-12 DIAGNOSIS — C787 Secondary malignant neoplasm of liver and intrahepatic bile duct: Secondary | ICD-10-CM | POA: Diagnosis present

## 2020-07-12 DIAGNOSIS — C229 Malignant neoplasm of liver, not specified as primary or secondary: Secondary | ICD-10-CM | POA: Diagnosis not present

## 2020-07-12 DIAGNOSIS — I10 Essential (primary) hypertension: Secondary | ICD-10-CM | POA: Diagnosis present

## 2020-07-12 DIAGNOSIS — M1990 Unspecified osteoarthritis, unspecified site: Secondary | ICD-10-CM | POA: Diagnosis present

## 2020-07-12 DIAGNOSIS — R1084 Generalized abdominal pain: Secondary | ICD-10-CM

## 2020-07-12 DIAGNOSIS — M549 Dorsalgia, unspecified: Secondary | ICD-10-CM

## 2020-07-12 DIAGNOSIS — Z8619 Personal history of other infectious and parasitic diseases: Secondary | ICD-10-CM

## 2020-07-12 DIAGNOSIS — R1310 Dysphagia, unspecified: Secondary | ICD-10-CM | POA: Diagnosis not present

## 2020-07-12 DIAGNOSIS — B3789 Other sites of candidiasis: Secondary | ICD-10-CM | POA: Diagnosis present

## 2020-07-12 DIAGNOSIS — G893 Neoplasm related pain (acute) (chronic): Secondary | ICD-10-CM | POA: Diagnosis present

## 2020-07-12 DIAGNOSIS — K2100 Gastro-esophageal reflux disease with esophagitis, without bleeding: Secondary | ICD-10-CM | POA: Diagnosis not present

## 2020-07-12 DIAGNOSIS — D75839 Thrombocytosis, unspecified: Secondary | ICD-10-CM | POA: Diagnosis present

## 2020-07-12 DIAGNOSIS — Z20822 Contact with and (suspected) exposure to covid-19: Secondary | ICD-10-CM | POA: Diagnosis present

## 2020-07-12 DIAGNOSIS — R911 Solitary pulmonary nodule: Secondary | ICD-10-CM | POA: Diagnosis not present

## 2020-07-12 DIAGNOSIS — R627 Adult failure to thrive: Secondary | ICD-10-CM | POA: Diagnosis present

## 2020-07-12 DIAGNOSIS — Z801 Family history of malignant neoplasm of trachea, bronchus and lung: Secondary | ICD-10-CM

## 2020-07-12 DIAGNOSIS — E785 Hyperlipidemia, unspecified: Secondary | ICD-10-CM | POA: Diagnosis present

## 2020-07-12 DIAGNOSIS — C801 Malignant (primary) neoplasm, unspecified: Secondary | ICD-10-CM | POA: Diagnosis not present

## 2020-07-12 DIAGNOSIS — N400 Enlarged prostate without lower urinary tract symptoms: Secondary | ICD-10-CM | POA: Diagnosis present

## 2020-07-12 DIAGNOSIS — K648 Other hemorrhoids: Secondary | ICD-10-CM | POA: Diagnosis present

## 2020-07-12 DIAGNOSIS — Z8042 Family history of malignant neoplasm of prostate: Secondary | ICD-10-CM

## 2020-07-12 DIAGNOSIS — R109 Unspecified abdominal pain: Secondary | ICD-10-CM

## 2020-07-12 DIAGNOSIS — K2270 Barrett's esophagus without dysplasia: Secondary | ICD-10-CM | POA: Diagnosis present

## 2020-07-12 DIAGNOSIS — R0902 Hypoxemia: Secondary | ICD-10-CM | POA: Diagnosis not present

## 2020-07-12 DIAGNOSIS — R634 Abnormal weight loss: Secondary | ICD-10-CM | POA: Diagnosis not present

## 2020-07-12 DIAGNOSIS — Z87891 Personal history of nicotine dependence: Secondary | ICD-10-CM

## 2020-07-12 DIAGNOSIS — C3411 Malignant neoplasm of upper lobe, right bronchus or lung: Secondary | ICD-10-CM | POA: Diagnosis present

## 2020-07-12 DIAGNOSIS — Z833 Family history of diabetes mellitus: Secondary | ICD-10-CM

## 2020-07-12 DIAGNOSIS — E1165 Type 2 diabetes mellitus with hyperglycemia: Secondary | ICD-10-CM | POA: Diagnosis not present

## 2020-07-12 DIAGNOSIS — Z7982 Long term (current) use of aspirin: Secondary | ICD-10-CM

## 2020-07-12 DIAGNOSIS — Z6822 Body mass index (BMI) 22.0-22.9, adult: Secondary | ICD-10-CM

## 2020-07-12 DIAGNOSIS — R1314 Dysphagia, pharyngoesophageal phase: Secondary | ICD-10-CM | POA: Diagnosis present

## 2020-07-12 DIAGNOSIS — N281 Cyst of kidney, acquired: Secondary | ICD-10-CM | POA: Diagnosis not present

## 2020-07-12 DIAGNOSIS — K5730 Diverticulosis of large intestine without perforation or abscess without bleeding: Secondary | ICD-10-CM | POA: Diagnosis not present

## 2020-07-12 DIAGNOSIS — R935 Abnormal findings on diagnostic imaging of other abdominal regions, including retroperitoneum: Secondary | ICD-10-CM

## 2020-07-12 LAB — URINALYSIS, ROUTINE W REFLEX MICROSCOPIC
Bilirubin Urine: NEGATIVE
Glucose, UA: NEGATIVE mg/dL
Hgb urine dipstick: NEGATIVE
Ketones, ur: NEGATIVE mg/dL
Leukocytes,Ua: NEGATIVE
Nitrite: NEGATIVE
Protein, ur: NEGATIVE mg/dL
Specific Gravity, Urine: 1.005 (ref 1.005–1.030)
pH: 7 (ref 5.0–8.0)

## 2020-07-12 LAB — RESP PANEL BY RT-PCR (FLU A&B, COVID) ARPGX2
Influenza A by PCR: NEGATIVE
Influenza B by PCR: NEGATIVE
SARS Coronavirus 2 by RT PCR: NEGATIVE

## 2020-07-12 LAB — CBC WITH DIFFERENTIAL/PLATELET
Abs Immature Granulocytes: 0.03 10*3/uL (ref 0.00–0.07)
Basophils Absolute: 0.1 10*3/uL (ref 0.0–0.1)
Basophils Relative: 1 %
Eosinophils Absolute: 0 10*3/uL (ref 0.0–0.5)
Eosinophils Relative: 0 %
HCT: 33 % — ABNORMAL LOW (ref 39.0–52.0)
Hemoglobin: 10.7 g/dL — ABNORMAL LOW (ref 13.0–17.0)
Immature Granulocytes: 0 %
Lymphocytes Relative: 6 %
Lymphs Abs: 0.7 10*3/uL (ref 0.7–4.0)
MCH: 28.8 pg (ref 26.0–34.0)
MCHC: 32.4 g/dL (ref 30.0–36.0)
MCV: 88.9 fL (ref 80.0–100.0)
Monocytes Absolute: 1.1 10*3/uL — ABNORMAL HIGH (ref 0.1–1.0)
Monocytes Relative: 9 %
Neutro Abs: 10.5 10*3/uL — ABNORMAL HIGH (ref 1.7–7.7)
Neutrophils Relative %: 84 %
Platelets: 607 10*3/uL — ABNORMAL HIGH (ref 150–400)
RBC: 3.71 MIL/uL — ABNORMAL LOW (ref 4.22–5.81)
RDW: 13.2 % (ref 11.5–15.5)
WBC: 12.4 10*3/uL — ABNORMAL HIGH (ref 4.0–10.5)
nRBC: 0 % (ref 0.0–0.2)

## 2020-07-12 LAB — LACTATE DEHYDROGENASE: LDH: 154 U/L (ref 98–192)

## 2020-07-12 LAB — COMPREHENSIVE METABOLIC PANEL
ALT: 13 U/L (ref 0–44)
AST: 25 U/L (ref 15–41)
Albumin: 2.9 g/dL — ABNORMAL LOW (ref 3.5–5.0)
Alkaline Phosphatase: 81 U/L (ref 38–126)
Anion gap: 7 (ref 5–15)
BUN: 12 mg/dL (ref 8–23)
CO2: 28 mmol/L (ref 22–32)
Calcium: 9.2 mg/dL (ref 8.9–10.3)
Chloride: 102 mmol/L (ref 98–111)
Creatinine, Ser: 1.09 mg/dL (ref 0.61–1.24)
GFR, Estimated: >60 mL/min (ref >60)
Glucose, Bld: 176 mg/dL — ABNORMAL HIGH (ref 70–99)
Potassium: 3.8 mmol/L (ref 3.5–5.1)
Sodium: 137 mmol/L (ref 135–145)
Total Bilirubin: 0.8 mg/dL (ref 0.3–1.2)
Total Protein: 7.1 g/dL (ref 6.5–8.1)

## 2020-07-12 LAB — LIPASE, BLOOD: Lipase: 22 U/L (ref 11–51)

## 2020-07-12 MED ORDER — ALPRAZOLAM 1 MG PO TABS
1.0000 mg | ORAL_TABLET | Freq: Two times a day (BID) | ORAL | Status: DC | PRN
  Administered 2020-07-12: 1 mg via ORAL
  Filled 2020-07-12: qty 1

## 2020-07-12 MED ORDER — MIRTAZAPINE 30 MG PO TABS
45.0000 mg | ORAL_TABLET | Freq: Every day | ORAL | Status: DC
  Administered 2020-07-12 – 2020-07-15 (×3): 45 mg via ORAL
  Filled 2020-07-12 (×4): qty 3

## 2020-07-12 MED ORDER — SODIUM CHLORIDE 0.9 % IV BOLUS
1000.0000 mL | Freq: Once | INTRAVENOUS | Status: AC
  Administered 2020-07-12: 1000 mL via INTRAVENOUS

## 2020-07-12 MED ORDER — PANTOPRAZOLE SODIUM 40 MG PO TBEC
40.0000 mg | DELAYED_RELEASE_TABLET | Freq: Two times a day (BID) | ORAL | Status: DC
  Administered 2020-07-12 – 2020-07-13 (×2): 40 mg via ORAL
  Filled 2020-07-12 (×2): qty 1

## 2020-07-12 MED ORDER — TERAZOSIN HCL 2 MG PO CAPS
2.0000 mg | ORAL_CAPSULE | Freq: Every day | ORAL | Status: DC
  Administered 2020-07-12 – 2020-07-15 (×4): 2 mg via ORAL
  Filled 2020-07-12 (×6): qty 1

## 2020-07-12 MED ORDER — SODIUM CHLORIDE (PF) 0.9 % IJ SOLN
INTRAMUSCULAR | Status: AC
  Filled 2020-07-12: qty 50

## 2020-07-12 MED ORDER — HYDROCODONE-ACETAMINOPHEN 5-325 MG PO TABS
1.0000 | ORAL_TABLET | ORAL | Status: DC | PRN
  Administered 2020-07-12 – 2020-07-15 (×8): 2 via ORAL
  Administered 2020-07-15: 1 via ORAL
  Administered 2020-07-15: 2 via ORAL
  Administered 2020-07-16 (×2): 1 via ORAL
  Filled 2020-07-12: qty 1
  Filled 2020-07-12 (×9): qty 2
  Filled 2020-07-12: qty 1
  Filled 2020-07-12: qty 2

## 2020-07-12 MED ORDER — LOSARTAN POTASSIUM 50 MG PO TABS
100.0000 mg | ORAL_TABLET | Freq: Every day | ORAL | Status: DC
  Administered 2020-07-13 – 2020-07-15 (×3): 100 mg via ORAL
  Filled 2020-07-12 (×3): qty 2

## 2020-07-12 MED ORDER — POLYETHYLENE GLYCOL 3350 17 G PO PACK: 17.0000 g | PACK | Freq: Every day | ORAL | Status: DC | PRN

## 2020-07-12 MED ORDER — MORPHINE SULFATE (PF) 2 MG/ML IV SOLN
2.0000 mg | INTRAVENOUS | Status: DC | PRN
  Administered 2020-07-12 – 2020-07-15 (×15): 2 mg via INTRAVENOUS
  Filled 2020-07-12 (×15): qty 1

## 2020-07-12 MED ORDER — ALPRAZOLAM 0.5 MG PO TABS
1.0000 mg | ORAL_TABLET | Freq: Once | ORAL | Status: AC
  Administered 2020-07-12: 1 mg via ORAL
  Filled 2020-07-12: qty 2

## 2020-07-12 MED ORDER — ATORVASTATIN CALCIUM 20 MG PO TABS
20.0000 mg | ORAL_TABLET | Freq: Every day | ORAL | Status: DC
  Administered 2020-07-13 – 2020-07-16 (×4): 20 mg via ORAL
  Filled 2020-07-12 (×4): qty 1

## 2020-07-12 MED ORDER — IOHEXOL 300 MG/ML  SOLN
75.0000 mL | Freq: Once | INTRAMUSCULAR | Status: AC | PRN
  Administered 2020-07-12: 75 mL via INTRAVENOUS

## 2020-07-12 MED ORDER — AMLODIPINE BESYLATE 5 MG PO TABS
5.0000 mg | ORAL_TABLET | Freq: Every day | ORAL | Status: DC
  Administered 2020-07-13 – 2020-07-16 (×3): 5 mg via ORAL
  Filled 2020-07-12 (×4): qty 1

## 2020-07-12 MED ORDER — HYDROCHLOROTHIAZIDE 25 MG PO TABS
25.0000 mg | ORAL_TABLET | Freq: Every day | ORAL | Status: DC
  Administered 2020-07-13 – 2020-07-15 (×3): 25 mg via ORAL
  Filled 2020-07-12 (×3): qty 1

## 2020-07-12 MED ORDER — GABAPENTIN 300 MG PO CAPS
600.0000 mg | ORAL_CAPSULE | Freq: Once | ORAL | Status: AC
  Administered 2020-07-12: 600 mg via ORAL
  Filled 2020-07-12: qty 2

## 2020-07-12 MED ORDER — ONDANSETRON HCL 4 MG PO TABS: 4.0000 mg | ORAL_TABLET | Freq: Four times a day (QID) | ORAL | Status: DC | PRN

## 2020-07-12 MED ORDER — ENOXAPARIN SODIUM 40 MG/0.4ML ~~LOC~~ SOLN
40.0000 mg | SUBCUTANEOUS | Status: DC
  Administered 2020-07-12 – 2020-07-13 (×2): 40 mg via SUBCUTANEOUS
  Filled 2020-07-12 (×2): qty 0.4

## 2020-07-12 MED ORDER — MORPHINE SULFATE (PF) 4 MG/ML IV SOLN
4.0000 mg | Freq: Once | INTRAVENOUS | Status: AC
  Administered 2020-07-12: 4 mg via INTRAVENOUS
  Filled 2020-07-12: qty 1

## 2020-07-12 MED ORDER — ACETAMINOPHEN 325 MG PO TABS: 650.0000 mg | ORAL_TABLET | Freq: Four times a day (QID) | ORAL | Status: DC | PRN

## 2020-07-12 MED ORDER — ONDANSETRON HCL 4 MG/2ML IJ SOLN: 4.0000 mg | Freq: Four times a day (QID) | INTRAMUSCULAR | Status: DC | PRN

## 2020-07-12 MED ORDER — ACETAMINOPHEN 650 MG RE SUPP: 650.0000 mg | Freq: Four times a day (QID) | RECTAL | Status: DC | PRN

## 2020-07-12 MED ORDER — ASPIRIN EC 81 MG PO TBEC: 81.0000 mg | DELAYED_RELEASE_TABLET | Freq: Every day | ORAL | Status: DC

## 2020-07-12 NOTE — ED Triage Notes (Signed)
Presents via EMS with c/o abd pain radiating thru to back. States it is always worse after eating. Scheduled for a colonoscopy/endoscopy this week. Denies n/v/d. Denies blood in stool.

## 2020-07-12 NOTE — ED Notes (Signed)
Pt to CT via stretcher

## 2020-07-12 NOTE — H&P (Signed)
History and Physical        Hospital Admission Note Date: 07/12/2020  Patient name: Lee Christensen Medical record number: [id number] Date of birth: [birth year] Age: 69 y.o. Gender: male  PCP: Redmond School, MD  Patient coming from: Home Lives with: Wife At baseline, ambulates: Independently  Chief Complaint    Chief Complaint  Patient presents with  . 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 pylori and GERD who presented to the ED with intermittent abdominal pain and low back pain for the past several months which has been worsening recently.  Patient states that his abdominal pain is worse after eating and radiates to the right side of his back.  No nausea vomiting or diarrhea, fever or chills.  Takes Tylenol with minimal relief and has been trying ice packs with some relief but as of lately has not been helping.  Pain is worse at night as well.  Admits to 20-30 pound weight loss over the past several months and at least 10 pounds in the past 2 weeks which is unintentional and due to avoiding food.  Patient states he is a former smoker and quit in the 90s.  Of note, he has extensive exposure to asbestos as he used to work as a Chief Strategy Officer.  Additionally, patient's wife states that this a.m. the patient coughed up a thick brown strand of mucus which is the first time he told his wife about this but he states this has been going on for an unknown period of time.   ED Course: Afebrile, tachycardic, hemodynamically stable, on room air. Notable Labs: WBC 12.4, Hb 10.7 otherwise unremarkable. Notable Imaging: CT abdomen pelvis without contrast: Interval development of multiple round ill-defined low-density lesions throughout the liver  suspicious for metastatic disease.  A CXR was ordered which showed interval development of RUL airspace opacity and suggestion of thickened right paratracheal stripe and CT chest was recommended by radiology.  CT chest with contrast: Posterior RUL lung mass which extends to involve the right hilum consistent with primary lung malignancy and 5 mm noncalcified LLL and left basilar noncalcified lung nodule. Patient received morphine with improved pain. ED provider discussed with Dr. Marin Olp, oncology, who recommended medicine admission and who would follow while inpatient.   Vitals:   07/12/20 1630 07/12/20 1744  BP: 94/68 131/72  Pulse: 87 82  Resp: 17 16  Temp:  98.7 F (37.1 C)  SpO2: 96% 98%     Review of Systems:  Review of Systems  All other systems reviewed and are negative.   Medical/Social/Family History   Past Medical History: Past Medical History:  Diagnosis Date  . Anemia   . Arthritis   . Bilateral inguinal hernia (BIH) s/p lap repair 12/29/2012 12/04/2012  . Chronic back pain   . Complication of anesthesia    slow to awaken after hernia surgery  . Diabetes mellitus without complication (HCC)    Borderline.  . Diverticulosis   . GERD (gastroesophageal reflux disease)   . Hemangioma of left chest wall s/p excision 12/29/2012  01/17/2013  . Hemangioma of left chest wall s/p excision 12/29/2012 01/17/2013  . Hypertension   . Knee pain   . Squamous cell carcinoma in situ (SCCIS) 09/18/2019   Left Shoulder (curet and 5FU)  . Squamous cell carcinoma in situ (SCCIS) 10/25/2019   Left Post Shoulder (treatment after biopsy)  . Squamous cell carcinoma in situ (SCCIS) 10/25/2019   Medial Scalp  . Squamous cell carcinoma in situ (SCCIS) 10/25/2019   Front Mid Scalp  . Squamous cell carcinoma of skin 10/25/2019   in situ-left post shoulder (txpbx), in situ-medial scalp- (CX35FU), in situ-front mid scalp- (CX35FU)  . Squamous cell carcinoma of skin 09/18/2019   in situ-Left  shoulder-(CX35FU)    Past Surgical History:  Procedure Laterality Date  . CATARACT EXTRACTION W/ INTRAOCULAR LENS  IMPLANT, BILATERAL  1996  . COLONOSCOPY  04/03/01   TIR:WERXVQMG hemorrhoids otherwise normal  . COLONOSCOPY N/A 11/09/2012   Dr. Gala Romney- normal rectum, scattered left-sided diverticula, the remainder of colonic mucosa appeared normal.  . ESOPHAGOGASTRODUODENOSCOPY  06/25/03   QQP:YPPJKD colored tongue-NO BARRETTs (EROSIVE ESOPHAGITIS)/small HH  . HERNIA REPAIR     ? lower abd  . INSERTION OF MESH Bilateral 12/29/2012   Procedure: INSERTION OF MESH;  Surgeon: Adin Hector, MD;  Location: [facility];  Service: General;  Laterality: Bilateral;  . KNEE SURGERY     left x 2  . LAPAROSCOPIC INGUINAL HERNIA WITH UMBILICAL HERNIA Bilateral 12/29/2012   Procedure: LAPAROSCOPIC INGUINAL HERNIA WITH UMBILICAL HERNIA;  Surgeon: Adin Hector, MD;  Location: [facility];  Service: General;  Laterality: Bilateral;  . MASS EXCISION Left 12/29/2012   Procedure: EXCISION MASS LEFT CHEST WALL;  Surgeon: Adin Hector, MD;  Location: [facility];  Service: General;  Laterality: Left;  . WISDOM TOOTH EXTRACTION      Medications: Prior to Admission medications   Medication Sig Start Date End Date Taking? Authorizing Provider  acetaminophen (TYLENOL) 650 MG CR tablet Take 650 mg by mouth every 8 (eight) hours as needed for pain (arthritis).    Yes [provider]  alprazolam Duanne Moron) 2 MG tablet Take 1 mg by mouth every 3 (three) hours.   Yes [provider]  amLODipine (NORVASC) 5 MG tablet Take 5 mg by mouth daily.    Yes [provider]  aspirin 81 MG tablet Take 81 mg by mouth daily.   Yes [provider]  atorvastatin (LIPITOR) 20 MG tablet Take 20 mg by mouth daily. 10/12/17  Yes [provider]  cholecalciferol (VITAMIN D3) 25 MCG (1000 UNIT) tablet Take 1,000 Units by mouth daily.   Yes [provider]  Coenzyme Q10 (CO Q 10) 100 MG CAPS Take  100 mg by mouth daily.   Yes [provider]  Fluorouracil (TOLAK) 4 % CREA Apply 1 application topically daily.    Yes [provider]  gabapentin (NEURONTIN) 100 MG capsule Take 100 mg by mouth daily as needed (Nerve pain).  10/25/19  Yes [provider]  gabapentin (NEURONTIN) 600 MG tablet Take 600 mg by mouth 3 (three) times daily.  11/08/19  Yes [provider]  hydrochlorothiazide (HYDRODIURIL) 25 MG tablet Take 25 mg by mouth daily.  08/11/12  Yes [provider]  losartan (COZAAR) 100 MG tablet Take 100 mg by mouth daily.   Yes [provider]  mirtazapine (REMERON) 45 MG tablet Take 45 mg by mouth at bedtime.   Yes [provider]  Multiple Vitamin (MULTIVITAMIN) tablet Take  1 tablet by mouth daily. Centrum Silver   Yes [provider]  pantoprazole (PROTONIX) 40 MG tablet Take 40 mg by mouth 2 (two) times daily.   Yes [provider]  Probiotic Product (ALIGN) 4 MG CAPS Take 4 mg by mouth daily.   Yes [provider]  tamsulosin (FLOMAX) 0.4 MG CAPS capsule Take 0.4 mg by mouth daily.  04/11/13  Yes [provider]  terazosin (HYTRIN) 2 MG capsule Take 2 mg by mouth at bedtime.    Yes [provider]  Turmeric 500 MG CAPS Take 500 mg by mouth daily.    Yes [provider]  Wheat Dextrin (BENEFIBER PO) Take 1 Scoop by mouth daily.    Yes [provider]  zinc gluconate 50 MG tablet Take 50 mg by mouth daily.   Yes [provider]  polyethylene glycol-electrolytes (TRILYTE) 420 g solution Take 4,000 mLs by mouth as directed. Patient not taking: Reported on 07/12/2020 06/24/20   Daneil Dolin, MD    Allergies:   Allergies  Allergen Reactions  . Penicillins Rash    Has patient had a PCN reaction causing immediate rash, facial/tongue/throat swelling, SOB or lightheadedness with hypotension: yes Has patient had a PCN reaction causing severe rash involving  mucus membranes or skin necrosis: no Has patient had a PCN reaction that required hospitalization: no Has patient had a PCN reaction occurring within the last 10 years:noIf all of the above answers are "NO", then may proceed with Cephalosporin use.     Social History:  reports that he quit smoking about 23 years ago. His smoking use included cigarettes. He has a 90.00 pack-year smoking history. He quit smokeless tobacco use about 25 years ago.  His smokeless tobacco use included chew. He reports current alcohol use of about 1.0 standard drink of alcohol per week. He reports that he does not use drugs.  Family History: Family History  Problem Relation Age of Onset  . Prostate cancer Father   . Diabetes Father   . Hypertension Father   . Stroke Father   . Lung cancer Mother      Objective   Physical Exam: Blood pressure 131/72, pulse 82, temperature 98.7 F (37.1 C), resp. rate 16, height 5\' 7"  (1.702 m), SpO2 98 %.  Physical Exam Vitals and nursing note reviewed.  Constitutional:      Appearance: Normal appearance.     Comments: Temporal wasting  HENT:     Head: Normocephalic and atraumatic.  Eyes:     Conjunctiva/sclera: Conjunctivae normal.  Cardiovascular:     Rate and Rhythm: Normal rate.     Pulses: Normal pulses.  Pulmonary:     Effort: Pulmonary effort is normal. No respiratory distress.  Abdominal:     General: Abdomen is flat.     Palpations: Abdomen is soft.  Musculoskeletal:        General: No swelling or tenderness.  Skin:    Coloration: Skin is not jaundiced or pale.  Neurological:     Mental Status: He is alert. Mental status is at baseline.  Psychiatric:        Mood and Affect: Mood normal.        Behavior: Behavior normal.     LABS on Admission: I have personally reviewed all the labs and imaging below    Basic Metabolic Panel: Recent Labs  Lab 07/12/20 1339  NA 137  K 3.8  CL 102  CO2 28  GLUCOSE 176*  BUN  12  CREATININE 1.09   CALCIUM 9.2   Liver Function Tests: Recent Labs  Lab 07/12/20 1339  AST 25  ALT 13  ALKPHOS 81  BILITOT 0.8  PROT 7.1  ALBUMIN 2.9*   Recent Labs  Lab 07/12/20 1339  LIPASE 22   No results for input(s): AMMONIA in the last 168 hours. CBC: Recent Labs  Lab 07/12/20 1339  WBC 12.4*  NEUTROABS 10.5*  HGB 10.7*  HCT 33.0*  MCV 88.9  PLT 607*   Cardiac Enzymes: No results for input(s): CKTOTAL, CKMB, CKMBINDEX, TROPONINI in the last 168 hours. BNP: Invalid input(s): POCBNP CBG: No results for input(s): GLUCAP in the last 168 hours.  Radiological Exams on Admission:  CT ABDOMEN PELVIS WO CONTRAST  Result Date: 07/12/2020 CLINICAL DATA:  Abdominal pain EXAM: CT ABDOMEN AND PELVIS WITHOUT CONTRAST TECHNIQUE: Multidetector CT imaging of the abdomen and pelvis was performed following the standard protocol without IV contrast. COMPARISON:  10/21/2017 FINDINGS: Lower chest: Prominent subpleural fat again noted overlying the lateral aspect of the right lower lobe. Lung bases are clear. Heart size is normal. Hepatobiliary: Interval development of multiple rounded ill-defined low-density lesions scattered throughout the liver highly suspicious for metastatic disease. Unremarkable appearance of the gallbladder. No hyperdense stone or biliary dilatation. Pancreas: Unremarkable. No pancreatic ductal dilatation or surrounding inflammatory changes. Spleen: Normal in size without focal abnormality. Adrenals/Urinary Tract: Unremarkable adrenal glands. Bilateral kidneys are within normal limits. No renal stone or hydronephrosis. Nonspecific bilateral perinephric stranding is similar to prior. Urinary bladder is within normal limits. Stomach/Bowel: Evaluation of the bowel is somewhat limited in the absence of oral or intravenous contrast. Small hiatal hernia. Stomach otherwise appears within normal limits. There are no dilated loops of small bowel. Scattered colonic diverticulosis. No focal bowel  wall thickening or inflammatory changes. A normal appendix is seen adjacent to the tip of the right hepatic lobe (series 4, image 62). Vascular/Lymphatic: Scattered aortoiliac atherosclerotic calcifications without aneurysm. No abdominopelvic lymphadenopathy. Reproductive: Prostate gland is within normal limits. Other: No free fluid. No abdominopelvic fluid collection. No pneumoperitoneum. No abdominal wall hernia. Musculoskeletal: No acute osseous abnormality. No suspicious bone lesion. IMPRESSION: 1. Interval development of multiple rounded ill-defined low-density lesions scattered throughout the liver highly suspicious for metastatic disease. No definite evidence of a primary malignancy within the abdomen or pelvis. 2. Colonic diverticulosis without evidence of acute diverticulitis. 3. Small hiatal hernia. 4. Aortic atherosclerosis. (ICD10-I70.0). Electronically Signed   By: Davina Poke D.O.   On: 07/12/2020 14:33   CT CHEST W CONTRAST  Result Date: 07/12/2020 CLINICAL DATA:  Cancer of unknown primary. EXAM: CT CHEST WITH CONTRAST TECHNIQUE: Multidetector CT imaging of the chest was performed during intravenous contrast administration. CONTRAST:  68mL OMNIPAQUE IOHEXOL 300 MG/ML  SOLN COMPARISON:  None. FINDINGS: Cardiovascular: No significant vascular findings. Normal heart size. No pericardial effusion. Mediastinum/Nodes: 2.5 cm x 1.5 cm and 2.1 cm x 1.4 cm pretracheal lymph nodes are seen. Thyroid gland, trachea, and esophagus demonstrate no significant findings. Lungs/Pleura: A 7.6 cm x 5.2 cm heterogeneous low-attenuation mass is seen within the posterior aspect of the right upper lobe. This extends to involve the right hilum. A 5 mm noncalcified lung nodule is seen within the posterior aspect of the left lung base (axial CT image 125, CT series number 5). A 5 mm ill-defined noncalcified lung nodule versus focal scar seen within the lateral aspect of the left lower lobe (axial CT image 107, CT  series number 5). There is no  evidence of a pleural effusion or pneumothorax. A small pleural based area of fat attenuation is seen along the anterolateral aspect of the mid right lung. Upper Abdomen: Numerous heterogeneous low-attenuation liver lesions are seen scattered throughout the right and left lobes of the liver. The largest measures approximately 2.0 cm x 1.7 cm and is seen near the caudate lobe. There is a small hiatal hernia. Musculoskeletal: No chest wall abnormality. No acute or significant osseous findings. IMPRESSION: 1. Posterior right upper lobe lung mass which extends to involve the right hilum, consistent with primary lung malignancy. 2. Numerous heterogeneous low-attenuation liver lesions, consistent with metastatic disease. 3. 5 mm noncalcified left lower lobe and left basilar noncalcified lung nodule versus focal scar. Electronically Signed   By: Virgina Norfolk M.D.   On: 07/12/2020 17:43   DG Chest Portable 1 View  Result Date: 07/12/2020 CLINICAL DATA:  Cough EXAM: PORTABLE CHEST 1 VIEW COMPARISON:  Chest x-ray 01/02/2018 FINDINGS: The heart size and mediastinal contours are within normal limits. Interval development of a right upper lobe airspace opacity. Suggestion of thickened right paratracheal stripe. No pulmonary edema. No pleural effusion. No pneumothorax. No acute osseous abnormality. IMPRESSION: Interval development of right upper lobe airspace opacity and suggestion of thickened right paratracheal stripe. Finding could represent infection/inflammation with underlying malignancy not excluded. Recommend CT chest with intravenous contrast for further evaluation. Electronically Signed   By: Iven Finn M.D.   On: 07/12/2020 15:29      EKG: not done   A & P   Principal Problem:   Pain management Active Problems:   Abdominal pain   Back pain   GERD (gastroesophageal reflux disease)   Liver mass   Lung mass   1. Abdominal pain/back pain  Pain management  for suspected newly diagnosed metastatic cancer a. Continue home gabapentin and started on Tylenol for mild pain, Norco for moderate pain and morphine for severe pain b. Palliative care consult to assist with pain management and establish with patient for support  2. Suspected newly diagnosed metastatic cancer with possible primary lung and mets to liver a. Oncology consulted b. Palliative care consulted  3. GERD and recent history of H. pylori a. Has GI follow-up scheduled for this week on Wednesday b. Continue home PPI  4. Anxiety/insomnia a. On chronic benzos-Xanax 1 mg every 3 hours and Remeron.  Confirmed with pharmacy b. Can continue Remeron but decrease Xanax to 1 mg twice daily as needed especially while on opiates  5. Hypertension a. Continue home HCTZ and losartan  6. Hyperlipidemia a. Continue home statin    DVT prophylaxis: Lovenox   Code Status: Full Code  Diet: Heart healthy Family Communication: Admission, patients condition and plan of care including tests being ordered have been discussed with the patient who indicates understanding and agrees with the plan and Code Status. Patient's wife was updated  Disposition Plan: The appropriate patient status for this patient is OBSERVATION. Observation status is judged to be reasonable and necessary in order to provide the required intensity of service to ensure the patient's safety. The patient's presenting symptoms, physical exam findings, and initial radiographic and laboratory data in the context of their medical condition is felt to place them at decreased risk for further clinical deterioration. Furthermore, it is anticipated that the patient will be medically stable for discharge from the hospital within 2 midnights of admission. The following factors support the patient status of observation.   " The patient's presenting symptoms include abdominal pain and  back pain. " The physical exam findings include temporal  wasting. " The initial radiographic and laboratory data are concerning for newly diagnosed malignancy.    Status is: Observation  The patient remains OBS appropriate and will d/c before 2 midnights.  Dispo: The patient is from: Home              Anticipated d/c is to: Home              Anticipated d/c date is: 1 day              Patient currently is not medically stable to d/c.         Consultants  . Oncology . Palliative care  Procedures  . None  Time Spent on Admission: 60 minutes    Harold Hedge, DO [facility]  07/12/2020, 6:43 PM

## 2020-07-12 NOTE — ED Provider Notes (Signed)
[facility] DEPT Provider Note   CSN: [id number] Arrival date & time: 07/12/20  1241     History Chief Complaint  Patient presents with  . Abdominal Pain    Lee Christensen is a 69 y.o. male with a past medical history significant for diabetes, diverticulosis, GERD, and chronic back pain who presents to the ED due to intermittent abdominal pain that has been ongoing for several months. Patient notes pain is worse after eating. Pain radiates to the right side of his back.  Denies nausea, vomiting, diarrhea. Denies fever and chills. He takes tylenol with moderate relief. Denies chest pain and shortness of breath. Previous bilateral inguinal hernia surgery. Denies urinary symptoms. Admits to a 20-30lb weight loss over the past few months. Denies tobacco use. Last alcoholic drink was roughly 15 years ago.  History obtained from patient and past medical records. No interpreter used during encounter.      Past Medical History:  Diagnosis Date  . Anemia   . Arthritis   . Bilateral inguinal hernia (BIH) s/p lap repair 12/29/2012 12/04/2012  . Chronic back pain   . Complication of anesthesia    slow to awaken after hernia surgery  . Diabetes mellitus without complication (HCC)    Borderline.  . Diverticulosis   . GERD (gastroesophageal reflux disease)   . Hemangioma of left chest wall s/p excision 12/29/2012 01/17/2013  . Hemangioma of left chest wall s/p excision 12/29/2012 01/17/2013  . Hypertension   . Knee pain   . Squamous cell carcinoma in situ (SCCIS) 09/18/2019   Left Shoulder (curet and 5FU)  . Squamous cell carcinoma in situ (SCCIS) 10/25/2019   Left Post Shoulder (treatment after biopsy)  . Squamous cell carcinoma in situ (SCCIS) 10/25/2019   Medial Scalp  . Squamous cell carcinoma in situ (SCCIS) 10/25/2019   Front Mid Scalp  . Squamous cell carcinoma of skin 10/25/2019   in situ-left post shoulder (txpbx), in situ-medial scalp- (CX35FU), in situ-front  mid scalp- (CX35FU)  . Squamous cell carcinoma of skin 09/18/2019   in situ-Left shoulder-(CX35FU)    Patient Active Problem List   Diagnosis Date Noted  . Chronic groin pain - right 08/15/2013  . Bilateral inguinal hernia (BIH) s/p lap repair 12/29/2012 12/04/2012  . Umbilical hernia s/p repair 12/2012 12/04/2012  . Encounter for screening colonoscopy 10/31/2012  . Diverticulosis of sigmoid colon 10/31/2012    Past Surgical History:  Procedure Laterality Date  . CATARACT EXTRACTION W/ INTRAOCULAR LENS  IMPLANT, BILATERAL  1996  . COLONOSCOPY  04/03/01   Z[facility]:DJMEQAST hemorrhoids otherwise normal  . COLONOSCOPY N/A 11/09/2012   Dr. Gala Romney- normal rectum, scattered left-sided diverticula, the remainder of colonic mucosa appeared normal.  . ESOPHAGOGASTRODUODENOSCOPY  06/25/03   MHD:QQIWLN colored tongue-NO BARRETTs (EROSIVE ESOPHAGITIS)/small HH  . HERNIA REPAIR     ? lower abd  . INSERTION OF MESH Bilateral 12/29/2012   Procedure: INSERTION OF MESH;  Surgeon: Adin Hector, MD;  Location: [facility];  Service: General;  Laterality: Bilateral;  . KNEE SURGERY     left x 2  . LAPAROSCOPIC INGUINAL HERNIA WITH UMBILICAL HERNIA Bilateral 12/29/2012   Procedure: LAPAROSCOPIC INGUINAL HERNIA WITH UMBILICAL HERNIA;  Surgeon: Adin Hector, MD;  Location: [facility];  Service: General;  Laterality: Bilateral;  . MASS EXCISION Left 12/29/2012   Procedure: EXCISION MASS LEFT CHEST WALL;  Surgeon: Adin Hector, MD;  Location: [facility];  Service: General;  Laterality: Left;  . WISDOM TOOTH EXTRACTION  Family History  Problem Relation Age of Onset  . Prostate cancer Father   . Diabetes Father   . Hypertension Father   . Stroke Father   . Lung cancer Mother     Social History   Tobacco Use  . Smoking status: Former Smoker    Packs/day: 3.00    Years: 30.00    Pack years: 90.00    Types: Cigarettes    Quit date: 08/16/1996    Years since quitting: 23.9  . Smokeless tobacco: Former  Systems developer    Types: Chew    Quit date: 11/01/1994  Vaping Use  . Vaping Use: Never used  Substance Use Topics  . Alcohol use: Yes    Alcohol/week: 1.0 standard drink    Types: 1 Cans of beer per week    Comment: Rarely.  . Drug use: No    Home Medications Prior to Admission medications   Medication Sig Start Date End Date Taking? Authorizing Provider  acetaminophen (TYLENOL) 650 MG CR tablet Take 650 mg by mouth every 8 (eight) hours as needed for pain (arthritis).     [provider]  alprazolam Duanne Moron) 2 MG tablet Take 1 mg by mouth every 3 (three) hours.    [provider]  amLODipine (NORVASC) 5 MG tablet Take 5 mg by mouth daily.     [provider]  aspirin 81 MG tablet Take 81 mg by mouth daily.    [provider]  atorvastatin (LIPITOR) 20 MG tablet Take 20 mg by mouth daily. 10/12/17   [provider]  cholecalciferol (VITAMIN D3) 25 MCG (1000 UNIT) tablet Take 1,000 Units by mouth daily.    [provider]  Coenzyme Q10 (CO Q 10) 100 MG CAPS Take 100 mg by mouth daily.    [provider]  diclofenac Sodium (VOLTAREN) 1 % GEL Apply 2 g topically 2 (two) times daily.  12/27/19   [provider]  Fluorouracil (TOLAK) 4 % CREA Apply 1 application topically daily.     [provider]  gabapentin (NEURONTIN) 100 MG capsule Take 100 mg by mouth daily as needed (Nerve pain).  10/25/19   [provider]  gabapentin (NEURONTIN) 600 MG tablet Take 600 mg by mouth 3 (three) times daily.  11/08/19   [provider]  hydrochlorothiazide (HYDRODIURIL) 25 MG tablet Take 25 mg by mouth daily.  08/11/12   [provider]  losartan (COZAAR) 100 MG tablet Take 100 mg by mouth daily.    [provider]  mirtazapine (REMERON) 45 MG tablet Take 45 mg by mouth at bedtime.    [provider]  Multiple Vitamin (MULTIVITAMIN) tablet Take 1 tablet by mouth daily. Centrum Silver    [provider]  nystatin (MYCOSTATIN) 100000 UNIT/ML suspension Use as directed 5 mLs in the mouth or throat 4 (four) times daily.  06/30/20   [provider]  oxybutynin (DITROPAN) 5 MG tablet Take 5 mg by mouth daily.  12/27/19   [provider]  pantoprazole (PROTONIX) 40 MG tablet Take 40 mg by mouth 2 (two) times daily.    [provider]  polyethylene glycol-electrolytes (TRILYTE) 420 g solution Take 4,000 mLs by mouth as directed. 06/24/20   Rourk, Cristopher Estimable, MD  Probiotic Product (ALIGN) 4 MG CAPS Take 4 mg by mouth daily.    [provider]  tamsulosin (FLOMAX) 0.4 MG CAPS capsule Take 0.4 mg by mouth daily.  04/11/13   [provider]  terazosin (HYTRIN) 2 MG capsule Take 2 mg by mouth at bedtime.     [provider]  Turmeric 500 MG CAPS Take 1,000 mg by mouth daily.     [provider]  Wheat Dextrin (BENEFIBER PO) Take 1 Scoop by mouth daily.     [provider]  zinc gluconate 50 MG tablet Take 50 mg by mouth daily.    [provider]    Allergies    Penicillins  Review of Systems   Review of Systems  Constitutional: Negative for chills and fever.  Respiratory: Negative for shortness of breath.   Cardiovascular: Negative for chest pain.  Gastrointestinal: Positive for abdominal pain. Negative for diarrhea, nausea and vomiting.  Musculoskeletal: Positive for back pain.  All other systems reviewed and are negative.   Physical Exam Updated Vital Signs BP 131/84   Pulse 100   Temp 98.6 F (37 C) (Oral)   Resp (!) 21   SpO2 96%   Physical Exam Vitals and nursing note reviewed.  Constitutional:      General: He is not in acute distress.    Appearance: He is not ill-appearing.  HENT:     Head: Normocephalic.     Mouth/Throat:     Comments: Dry mucus membranes Eyes:     Pupils: Pupils are equal, round, and reactive to light.  Cardiovascular:     Rate and Rhythm: Regular rhythm.  Tachycardia present.     Pulses: Normal pulses.     Heart sounds: Normal heart sounds. No murmur heard.  No friction rub. No gallop.   Pulmonary:     Effort: Pulmonary effort is normal.     Breath sounds: Normal breath sounds.  Abdominal:     General: Abdomen is flat. Bowel sounds are normal. There is no distension.     Palpations: Abdomen is soft.     Tenderness: There is abdominal tenderness. There is right CVA tenderness. There is no left CVA tenderness, guarding or rebound.     Comments: Diffuse abdominal tenderness.  Musculoskeletal:     Cervical back: Neck supple.     Comments: Able to move all 4 extremities without difficulty.   Skin:    General: Skin is warm and dry.  Neurological:     General: No focal deficit present.     Mental Status: He is alert.  Psychiatric:        Mood and Affect: Mood normal.        Behavior: Behavior normal.     ED Results / Procedures / Treatments   Labs (all labs ordered are listed, but only abnormal results are displayed) Labs Reviewed  CBC WITH DIFFERENTIAL/PLATELET - Abnormal; Notable for the following components:      Result Value   WBC 12.4 (*)    RBC 3.71 (*)    Hemoglobin 10.7 (*)    HCT 33.0 (*)    Platelets 607 (*)    Neutro Abs 10.5 (*)    Monocytes Absolute 1.1 (*)    All other components within normal limits  COMPREHENSIVE METABOLIC PANEL - Abnormal; Notable for the following components:   Glucose, Bld 176 (*)    Albumin 2.9 (*)    All other components within normal limits  RESP PANEL BY RT-PCR (FLU A&B, COVID) ARPGX2  LIPASE, BLOOD  URINALYSIS, ROUTINE W REFLEX MICROSCOPIC  LACTATE DEHYDROGENASE  CEA    EKG None  Radiology CT ABDOMEN PELVIS WO CONTRAST  Result Date: 07/12/2020 CLINICAL DATA:  Abdominal  pain EXAM: CT ABDOMEN AND PELVIS WITHOUT CONTRAST TECHNIQUE: Multidetector CT imaging of the abdomen and pelvis was performed following the standard protocol without IV contrast. COMPARISON:  10/21/2017  FINDINGS: Lower chest: Prominent subpleural fat again noted overlying the lateral aspect of the right lower lobe. Lung bases are clear. Heart size is normal. Hepatobiliary: Interval development of multiple rounded ill-defined low-density lesions scattered throughout the liver highly suspicious for metastatic disease. Unremarkable appearance of the gallbladder. No hyperdense stone or biliary dilatation. Pancreas: Unremarkable. No pancreatic ductal dilatation or surrounding inflammatory changes. Spleen: Normal in size without focal abnormality. Adrenals/Urinary Tract: Unremarkable adrenal glands. Bilateral kidneys are within normal limits. No renal stone or hydronephrosis. Nonspecific bilateral perinephric stranding is similar to prior. Urinary bladder is within normal limits. Stomach/Bowel: Evaluation of the bowel is somewhat limited in the absence of oral or intravenous contrast. Small hiatal hernia. Stomach otherwise appears within normal limits. There are no dilated loops of small bowel. Scattered colonic diverticulosis. No focal bowel wall thickening or inflammatory changes. A normal appendix is seen adjacent to the tip of the right hepatic lobe (series 4, image 62). Vascular/Lymphatic: Scattered aortoiliac atherosclerotic calcifications without aneurysm. No abdominopelvic lymphadenopathy. Reproductive: Prostate gland is within normal limits. Other: No free fluid. No abdominopelvic fluid collection. No pneumoperitoneum. No abdominal wall hernia. Musculoskeletal: No acute osseous abnormality. No suspicious bone lesion. IMPRESSION: 1. Interval development of multiple rounded ill-defined low-density lesions scattered throughout the liver highly suspicious for metastatic disease. No definite evidence of a primary malignancy within the abdomen or pelvis. 2. Colonic diverticulosis without evidence of acute diverticulitis. 3. Small hiatal hernia. 4. Aortic atherosclerosis. (ICD10-I70.0). Electronically Signed   By:  Davina Poke D.O.   On: 07/12/2020 14:33    Procedures Procedures (including critical care time)  Medications Ordered in ED Medications  morphine 4 MG/ML injection 4 mg (has no administration in time range)  sodium chloride 0.9 % bolus 1,000 mL (0 mLs Intravenous Stopped 07/12/20 1522)  ALPRAZolam (XANAX) tablet 1 mg (1 mg Oral Given 07/12/20 1515)  gabapentin (NEURONTIN) capsule 600 mg (600 mg Oral Given 07/12/20 1515)    ED Course  I have reviewed the triage vital signs and the nursing notes.  Pertinent labs & imaging results that were available during my care of the patient were reviewed by me and considered in my medical decision making (see chart for details).  Clinical Course as of Jul 12 1524  Sat Jul 12, 2020  1439 WBC(!): 12.4 [CA]  1503 Patient request home dose of xanax and gabapentin. Order placed.   [CA]    Clinical Course User Index [CA] Suzy Bouchard, PA-C   MDM Rules/Calculators/A&P                         69 year old male presents to the ED due to ongoing abdominal pain for the past few months.  Upon arrival, patient is afebrile tachycardic at 115 with mild elevation in BP at 150/80.  Patient no acute distress and nontoxic-appearing.    Physical exam significant for diffuse abdominal tenderness.  No rebound or guarding.  Dry mucus membranes. Will obtain routine labs to check renal function, electrolyte abnormalities, infectious etiology.  UA to rule out acute cystitis.  CT abdomen to rule out any intra abdominal abnormalities. IVFs given.  CBC significant for mild leukocytosis at 12.4, thrombocytosis at 607, and anemia with hemoglobin at 10.7.  Lipase normal at 22. Doubt pancreatitis. CMP significant for hyperglycemia at 176.  No anion gap. Doubt DKA.  CT abdomen personally reviewed which demonstrates: IMPRESSION:  1. Interval development of multiple rounded ill-defined low-density  lesions scattered throughout the liver highly suspicious for  metastatic  disease. No definite evidence of a primary malignancy  within the abdomen or pelvis.  2. Colonic diverticulosis without evidence of acute diverticulitis.  3. Small hiatal hernia.  4. Aortic atherosclerosis. (ICD10-I70.0).   Discussed case with Dr. Roderic Palau who evaluated patient at bedside and agrees with assessment and plan. Discussed case with Dr. Marin Olp with oncology who will follow patient during admission. CEA level and LDH ordered per request.   Discussed case with Dr. Neysa Bonito who agrees to admit patient for further further treatment. COVID test ordered.  Final Clinical Impression(s) / ED Diagnoses Final diagnoses:  Generalized abdominal pain    Rx / DC Orders ED Discharge Orders    None       Karie Kirks 07/12/20 1531    Milton Ferguson, MD 07/13/20 [id number]

## 2020-07-12 NOTE — ED Notes (Signed)
Report called to [employer].

## 2020-07-13 ENCOUNTER — Encounter (HOSPITAL_COMMUNITY): Payer: Self-pay | Admitting: Internal Medicine

## 2020-07-13 DIAGNOSIS — Z20822 Contact with and (suspected) exposure to covid-19: Secondary | ICD-10-CM | POA: Diagnosis present

## 2020-07-13 DIAGNOSIS — R52 Pain, unspecified: Secondary | ICD-10-CM

## 2020-07-13 DIAGNOSIS — E860 Dehydration: Secondary | ICD-10-CM | POA: Diagnosis not present

## 2020-07-13 DIAGNOSIS — Z77090 Contact with and (suspected) exposure to asbestos: Secondary | ICD-10-CM | POA: Diagnosis present

## 2020-07-13 DIAGNOSIS — G893 Neoplasm related pain (acute) (chronic): Secondary | ICD-10-CM | POA: Diagnosis present

## 2020-07-13 DIAGNOSIS — R1310 Dysphagia, unspecified: Secondary | ICD-10-CM | POA: Diagnosis not present

## 2020-07-13 DIAGNOSIS — Z7189 Other specified counseling: Secondary | ICD-10-CM

## 2020-07-13 DIAGNOSIS — R918 Other nonspecific abnormal finding of lung field: Secondary | ICD-10-CM | POA: Diagnosis not present

## 2020-07-13 DIAGNOSIS — B370 Candidal stomatitis: Secondary | ICD-10-CM | POA: Diagnosis not present

## 2020-07-13 DIAGNOSIS — R634 Abnormal weight loss: Secondary | ICD-10-CM | POA: Diagnosis not present

## 2020-07-13 DIAGNOSIS — K219 Gastro-esophageal reflux disease without esophagitis: Secondary | ICD-10-CM | POA: Diagnosis present

## 2020-07-13 DIAGNOSIS — Z880 Allergy status to penicillin: Secondary | ICD-10-CM | POA: Diagnosis not present

## 2020-07-13 DIAGNOSIS — E119 Type 2 diabetes mellitus without complications: Secondary | ICD-10-CM | POA: Diagnosis present

## 2020-07-13 DIAGNOSIS — E785 Hyperlipidemia, unspecified: Secondary | ICD-10-CM | POA: Diagnosis present

## 2020-07-13 DIAGNOSIS — D75839 Thrombocytosis, unspecified: Secondary | ICD-10-CM | POA: Diagnosis present

## 2020-07-13 DIAGNOSIS — C3490 Malignant neoplasm of unspecified part of unspecified bronchus or lung: Secondary | ICD-10-CM

## 2020-07-13 DIAGNOSIS — R1314 Dysphagia, pharyngoesophageal phase: Secondary | ICD-10-CM | POA: Diagnosis present

## 2020-07-13 DIAGNOSIS — K222 Esophageal obstruction: Secondary | ICD-10-CM | POA: Diagnosis present

## 2020-07-13 DIAGNOSIS — R160 Hepatomegaly, not elsewhere classified: Secondary | ICD-10-CM

## 2020-07-13 DIAGNOSIS — K5750 Diverticulosis of both small and large intestine without perforation or abscess without bleeding: Secondary | ICD-10-CM | POA: Diagnosis present

## 2020-07-13 DIAGNOSIS — F40240 Claustrophobia: Secondary | ICD-10-CM | POA: Diagnosis present

## 2020-07-13 DIAGNOSIS — Z515 Encounter for palliative care: Secondary | ICD-10-CM | POA: Diagnosis not present

## 2020-07-13 DIAGNOSIS — C787 Secondary malignant neoplasm of liver and intrahepatic bile duct: Secondary | ICD-10-CM | POA: Diagnosis present

## 2020-07-13 DIAGNOSIS — R1084 Generalized abdominal pain: Secondary | ICD-10-CM | POA: Diagnosis not present

## 2020-07-13 DIAGNOSIS — I10 Essential (primary) hypertension: Secondary | ICD-10-CM | POA: Diagnosis present

## 2020-07-13 DIAGNOSIS — D509 Iron deficiency anemia, unspecified: Secondary | ICD-10-CM | POA: Diagnosis present

## 2020-07-13 DIAGNOSIS — G4700 Insomnia, unspecified: Secondary | ICD-10-CM | POA: Diagnosis present

## 2020-07-13 DIAGNOSIS — N400 Enlarged prostate without lower urinary tract symptoms: Secondary | ICD-10-CM | POA: Diagnosis present

## 2020-07-13 DIAGNOSIS — R9431 Abnormal electrocardiogram [ECG] [EKG]: Secondary | ICD-10-CM | POA: Diagnosis not present

## 2020-07-13 DIAGNOSIS — N179 Acute kidney failure, unspecified: Secondary | ICD-10-CM | POA: Diagnosis not present

## 2020-07-13 DIAGNOSIS — K2100 Gastro-esophageal reflux disease with esophagitis, without bleeding: Secondary | ICD-10-CM | POA: Diagnosis not present

## 2020-07-13 DIAGNOSIS — C3411 Malignant neoplasm of upper lobe, right bronchus or lung: Secondary | ICD-10-CM | POA: Diagnosis present

## 2020-07-13 DIAGNOSIS — Z801 Family history of malignant neoplasm of trachea, bronchus and lung: Secondary | ICD-10-CM | POA: Diagnosis not present

## 2020-07-13 DIAGNOSIS — B3789 Other sites of candidiasis: Secondary | ICD-10-CM | POA: Diagnosis present

## 2020-07-13 DIAGNOSIS — C799 Secondary malignant neoplasm of unspecified site: Secondary | ICD-10-CM

## 2020-07-13 DIAGNOSIS — F419 Anxiety disorder, unspecified: Secondary | ICD-10-CM | POA: Diagnosis present

## 2020-07-13 HISTORY — DX: Other specified counseling: Z71.89

## 2020-07-13 HISTORY — DX: Secondary malignant neoplasm of liver and intrahepatic bile duct: C78.7

## 2020-07-13 HISTORY — DX: Malignant neoplasm of unspecified part of unspecified bronchus or lung: C34.90

## 2020-07-13 HISTORY — DX: Secondary malignant neoplasm of unspecified site: C79.9

## 2020-07-13 LAB — HIV ANTIBODY (ROUTINE TESTING W REFLEX): HIV Screen 4th Generation wRfx: NONREACTIVE

## 2020-07-13 LAB — CBC
HCT: 31.9 % — ABNORMAL LOW (ref 39.0–52.0)
Hemoglobin: 10.1 g/dL — ABNORMAL LOW (ref 13.0–17.0)
MCH: 28.2 pg (ref 26.0–34.0)
MCHC: 31.7 g/dL (ref 30.0–36.0)
MCV: 89.1 fL (ref 80.0–100.0)
Platelets: 535 10*3/uL — ABNORMAL HIGH (ref 150–400)
RBC: 3.58 MIL/uL — ABNORMAL LOW (ref 4.22–5.81)
RDW: 13.4 % (ref 11.5–15.5)
WBC: 9 10*3/uL (ref 4.0–10.5)
nRBC: 0 % (ref 0.0–0.2)

## 2020-07-13 LAB — IRON AND TIBC
Iron: 20 ug/dL — ABNORMAL LOW (ref 45–182)
Saturation Ratios: 8 % — ABNORMAL LOW (ref 17.9–39.5)
TIBC: 236 ug/dL — ABNORMAL LOW (ref 250–450)
UIBC: 216 ug/dL

## 2020-07-13 LAB — BASIC METABOLIC PANEL
Anion gap: 10 (ref 5–15)
BUN: 9 mg/dL (ref 8–23)
CO2: 24 mmol/L (ref 22–32)
Calcium: 9.5 mg/dL (ref 8.9–10.3)
Chloride: 105 mmol/L (ref 98–111)
Creatinine, Ser: 0.94 mg/dL (ref 0.61–1.24)
GFR, Estimated: >60 mL/min (ref >60)
Glucose, Bld: 99 mg/dL (ref 70–99)
Potassium: 3.7 mmol/L (ref 3.5–5.1)
Sodium: 139 mmol/L (ref 135–145)

## 2020-07-13 LAB — FERRITIN: Ferritin: 132 ng/mL (ref 24–336)

## 2020-07-13 LAB — PREALBUMIN: Prealbumin: 13.3 mg/dL — ABNORMAL LOW (ref 18–38)

## 2020-07-13 MED ORDER — PEG-KCL-NACL-NASULF-NA ASC-C 100 G PO SOLR
0.5000 | Freq: Once | ORAL | Status: AC
  Administered 2020-07-13: 100 g via ORAL
  Filled 2020-07-13: qty 1

## 2020-07-13 MED ORDER — FAMOTIDINE 20 MG PO TABS
20.0000 mg | ORAL_TABLET | Freq: Every day | ORAL | Status: DC
  Administered 2020-07-13: 20 mg via ORAL
  Filled 2020-07-13: qty 1

## 2020-07-13 MED ORDER — SODIUM CHLORIDE 0.9 % IV SOLN: INTRAVENOUS | Status: DC

## 2020-07-13 MED ORDER — PANTOPRAZOLE SODIUM 40 MG PO TBEC: 40.0000 mg | DELAYED_RELEASE_TABLET | Freq: Two times a day (BID) | ORAL | Status: DC

## 2020-07-13 MED ORDER — PANTOPRAZOLE SODIUM 40 MG IV SOLR
40.0000 mg | Freq: Two times a day (BID) | INTRAVENOUS | Status: DC
  Administered 2020-07-13: 40 mg via INTRAVENOUS
  Filled 2020-07-13: qty 40

## 2020-07-13 MED ORDER — ALPRAZOLAM 1 MG PO TABS
1.0000 mg | ORAL_TABLET | Freq: Four times a day (QID) | ORAL | Status: DC | PRN
  Administered 2020-07-14 – 2020-07-16 (×7): 1 mg via ORAL
  Filled 2020-07-13 (×8): qty 1

## 2020-07-13 MED ORDER — BISACODYL 5 MG PO TBEC
10.0000 mg | DELAYED_RELEASE_TABLET | Freq: Once | ORAL | Status: AC
  Administered 2020-07-13: 10 mg via ORAL
  Filled 2020-07-13: qty 2

## 2020-07-13 MED ORDER — PHENOL 1.4 % MT LIQD
1.0000 | OROMUCOSAL | Status: DC | PRN
  Filled 2020-07-13 (×2): qty 177

## 2020-07-13 MED ORDER — PEG-KCL-NACL-NASULF-NA ASC-C 100 G PO SOLR: 1.0000 | Freq: Once | ORAL | Status: DC

## 2020-07-13 MED ORDER — PEG-KCL-NACL-NASULF-NA ASC-C 100 G PO SOLR
0.5000 | Freq: Once | ORAL | Status: AC
  Administered 2020-07-14: 100 g via ORAL
  Filled 2020-07-13: qty 1

## 2020-07-13 MED ORDER — GABAPENTIN 300 MG PO CAPS
600.0000 mg | ORAL_CAPSULE | Freq: Three times a day (TID) | ORAL | Status: DC
  Administered 2020-07-13 – 2020-07-15 (×6): 600 mg via ORAL
  Filled 2020-07-13 (×6): qty 2

## 2020-07-13 MED ORDER — ALPRAZOLAM 1 MG PO TABS
1.0000 mg | ORAL_TABLET | Freq: Four times a day (QID) | ORAL | Status: DC | PRN
  Administered 2020-07-13: 1 mg via ORAL
  Filled 2020-07-13: qty 1

## 2020-07-13 MED ORDER — LIP MEDEX EX OINT
TOPICAL_OINTMENT | CUTANEOUS | Status: AC
  Administered 2020-07-13: 1
  Filled 2020-07-13: qty 7

## 2020-07-13 NOTE — Consult Note (Signed)
NAME:  Lee Christensen, MRN:  [id number], DOB:  08/06/1951, LOS: 0 ADMISSION DATE:  07/12/2020, CONSULTATION DATE:  11/28 REFERRING MD:  Triad , CHIEF COMPLAINT:  RUQ pain    Brief History   69 yowm remote smoker with sev months wt oss with intermittent RUQ pain with w/u c/w primary lung ca RUL with ? 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 pylori and GERD who presented to the ED with intermittent abdominal pain and low back pain for the past several months which has been worsening recently > abdominal pain is worse after eating and radiates to the right side of his back.  No nausea vomiting or diarrhea, fever or chills.  Takes Tylenol with minimal relief and has been trying ice packs with some relief but as of lately has not been helping.  Pain is worse at night as well.  Admits to 20-30 pound weight loss over the past several months and at least 10 pounds in the past 2 weeks which is unintentional and due to avoiding food.  Patient states he is a former smoker and quit in the 90s.  Of note, he has extensive exposure to asbestos as he used to work as a Chief Strategy Officer.     ED Course: Afebrile, tachycardic, hemodynamically stable, on room air. Notable Labs: WBC 12.4, Hb 10.7 otherwise unremarkable. Notable Imaging: CT abdomen pelvis without contrast: Interval development of multiple round ill-defined low-density lesions throughout the liver suspicious for metastatic disease.  A CXR was ordered which showed interval development of RUL airspace opacity and suggestion of thickened right paratracheal stripe and CT chest was recommended by radiology.  CT chest with contrast: Posterior RUL lung mass which extends to involve the right hilum consistent with  primary lung malignancy and 5 mm noncalcified LLL and left basilar noncalcified lung nodule. Patient received morphine with improved pain. ED provider discussed with Dr. Marin Olp, oncology, who recommended medicine admission and who would follow while inpatient.  No obvious patterns in maint c/o abd pain in terms of day to day or daytime variability or assoc excess/ purulent sputum or mucus plugs or hemoptysis or cp or chest tightness, subjective wheeze or overt sinus or hb symptoms.   Sleeping  without nocturnal  or early am exacerbation  of respiratory  c/o's or need for noct saba. Also denies any obvious fluctuation of symptoms with weather or environmental changes or other aggravating or alleviating factors except as outlined above   No unusual exposure hx or h/o childhood pna/ asthma or knowledge of premature birth.  Current Allergies, Complete Past Medical History, Past Surgical History, Family History, and Social History were reviewed in [facility] record.  ROS  The following are not active complaints unless bolded Hoarseness, sore throat, dysphagia, dental problems, itching, sneezing,  nasal congestion or discharge of excess mucus or purulent secretions, ear ache,   fever, chills, sweats, unintended wt loss or wt gain, classically pleuritic or exertional cp,  orthopnea pnd or arm/hand swelling  or leg swelling, presyncope, palpitations, abdominal pain - describes as colicky (30 sec severe then eases up) and generalized (takes hand and circles entire upper and lower abd as to location) , anorexia, nausea, vomiting, diarrhea  or change in  bowel habits or change in bladder habits, change in stools or change in urine, dysuria, hematuria,  rash, arthralgias/back pain, visual complaints, headache, numbness, weakness or ataxia or problems with walking or coordination,  change in mood or  memory.            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 pylori and GERD   Significant Hospital Events     Consults:  Oncology 11/28 PCCM  11/28  GI   11/28   Procedures:     Significant Diagnostic Tests:   CT w contrast 11/27  1. Posterior right upper lobe lung mass which extends to involve the right hilum, consistent with primary lung malignancy. 2. Numerous heterogeneous low-attenuation liver lesions, consistent with metastatic disease. 3. 5 mm noncalcified left lower lobe and left basilar noncalcified lung nodule versus focal scar.  Micro Data:  COVID 19  / flu PCR  11/27 neg   Antimicrobials:      Scheduled Meds: . amLODipine  5 mg Oral Daily  . atorvastatin  20 mg Oral Daily  . enoxaparin (LOVENOX) injection  40 mg Subcutaneous Q24H  . hydrochlorothiazide  25 mg Oral Daily  . losartan  100 mg Oral Daily  . mirtazapine  45 mg Oral QHS  . pantoprazole  40 mg Oral BID  . terazosin  2 mg Oral QHS   Continuous Infusions: PRN Meds:.acetaminophen **OR** acetaminophen, ALPRAZolam, HYDROcodone-acetaminophen, morphine injection, ondansetron **OR** ondansetron (ZOFRAN) IV, phenol, polyethylene glycol  Interim history/subjective:  Chronically ill amb wm nad on RA  Objective   Blood pressure 117/64, pulse 87, temperature 98.2 F (36.8 C), temperature source Oral, resp. rate 17, height _0  (1.702 m), SpO2 95 %.        Intake/Output Summary (Last 24 hours) at 07/13/2020 1301 Last data filed at 07/13/2020 0934 Gross per 24 hour  Intake 1056.1 ml  Output 800 ml  Net 256.1 ml     Examination: General: Tmax  98.7  02  sats 95% on RA  HENT no cx nodes Lungs: minimal rhonchi RUl Cardiovascular: RRR no s3 or increase P2 Abdomen: slt fullness RUQ  Extremities: warm s cyanosis clubbing or edema  Neuro: sensorium intact  Resolved Hospital Problem list      Assessment & Plan:   1)  Bronchogenic ca RUL primary with likely liver mets causing RUQ pain though would  not expect it to be colicky so may have additional GI sources for the pain > w/u in progress.  Widely met dz with endobronchial dz RUL orifice by my review of CT so should be able to get good tissue dx with fob/ endobronchial bx and complete the staging w/u with PET as outpt and if can't get tissue dx easily with FOB (which is unlikely) then liver bx an option or other accessible tissue as guided by PET though this would all be outpt basis.  If we can't get him on schdule early this week ok to wait a week or two and rx symptoms in meantime          Labs   CBC: Recent Labs  Lab 07/12/20 1339 07/13/20 0602  WBC 12.4* 9.0  NEUTROABS 10.5*  --   HGB 10.7* 10.1*  HCT 33.0* 31.9*  MCV 88.9 89.1  PLT 607* 535*    Basic Metabolic Panel: Recent Labs  Lab 07/12/20 1339 07/13/20 0602  NA 137 139  K 3.8 3.7  CL 102 105  CO2 28 24  GLUCOSE 176* 99  BUN 12 9  CREATININE 1.09 0.94  CALCIUM 9.2 9.5   GFR: CrCl cannot be calculated (Unknown ideal weight.). Recent Labs  Lab 07/12/20 1339 07/13/20 0602  WBC 12.4* 9.0    Liver Function Tests: Recent Labs  Lab 07/12/20 1339  AST 25  ALT 13  ALKPHOS 81  BILITOT 0.8  PROT 7.1  ALBUMIN 2.9*   Recent Labs  Lab 07/12/20 1339  LIPASE 22   No results for input(s): AMMONIA in the last 168 hours.  ABG No results found for: PHART, PCO2ART, PO2ART, HCO3, TCO2, ACIDBASEDEF, O2SAT   Coagulation Profile: No results for input(s): INR, PROTIME in the last 168 hours.  Cardiac Enzymes: No results for input(s): CKTOTAL, CKMB, CKMBINDEX, TROPONINI in the last 168 hours.  HbA1C: No results found for: HGBA1C  CBG: No results for input(s): GLUCAP in the last 168 hours.    Past Medical History  He,  has a past medical history of Anemia, Arthritis, Bilateral inguinal hernia (BIH) s/p lap repair 12/29/2012 (12/04/2012), Chronic back pain, Complication of anesthesia, Diabetes mellitus without complication ([place]), Diverticulosis, GERD  (gastroesophageal reflux disease), Hemangioma of left chest wall s/p excision 12/29/2012 (01/17/2013), Hemangioma of left chest wall s/p excision 12/29/2012 (01/17/2013), Hypertension, Knee pain, Squamous cell carcinoma in situ (SCCIS) (09/18/2019), Squamous cell carcinoma in situ (SCCIS) (10/25/2019), Squamous cell carcinoma in situ (SCCIS) (10/25/2019), Squamous cell carcinoma in situ (SCCIS) (10/25/2019), Squamous cell carcinoma of skin (10/25/2019), and Squamous cell carcinoma of skin (09/18/2019).   Surgical History    Past Surgical History:  Procedure Laterality Date  . CATARACT EXTRACTION W/ INTRAOCULAR LENS  IMPLANT, BILATERAL  1996  . COLONOSCOPY  04/03/01   MBE:MLJQGBEE hemorrhoids otherwise normal  . COLONOSCOPY N/A 11/09/2012   Dr. Gala Romney- normal rectum, scattered left-sided diverticula, the remainder of colonic mucosa appeared normal.  . ESOPHAGOGASTRODUODENOSCOPY  06/25/03   FEO:FHQRFX colored tongue-NO BARRETTs (EROSIVE ESOPHAGITIS)/small HH  . HERNIA REPAIR     ? lower abd  . INSERTION OF MESH Bilateral 12/29/2012   Procedure: INSERTION OF MESH;  Surgeon: Adin Hector, MD;  Location: [facility];  Service: General;  Laterality: Bilateral;  . KNEE SURGERY     left x 2  . LAPAROSCOPIC INGUINAL HERNIA WITH UMBILICAL HERNIA Bilateral 12/29/2012   Procedure: LAPAROSCOPIC INGUINAL HERNIA WITH UMBILICAL HERNIA;  Surgeon: Adin Hector, MD;  Location: [facility];  Service: General;  Laterality: Bilateral;  . MASS EXCISION Left 12/29/2012   Procedure: EXCISION MASS LEFT CHEST WALL;  Surgeon: Adin Hector, MD;  Location: [facility];  Service: General;  Laterality: Left;  . WISDOM TOOTH EXTRACTION       Social History   reports that he quit smoking about 23 years ago. His smoking use included cigarettes. He has a 90.00 pack-year smoking history. He quit smokeless tobacco use about 25 years ago.  His smokeless tobacco use included chew. He reports current alcohol use of about 1.0 standard drink of  alcohol per week. He reports that he does not use drugs.   Family History   His family history includes Diabetes in his father; Hypertension in his father; Lung cancer in his mother; Prostate cancer in his father; Stroke in his father.   Allergies Allergies  Allergen Reactions  . Penicillins Rash    Has patient had a PCN reaction causing immediate rash, facial/tongue/throat swelling, SOB or lightheadedness with hypotension: yes Has patient had a PCN reaction causing severe  rash involving mucus membranes or skin necrosis: no Has patient had a PCN reaction that required hospitalization: no Has patient had a PCN reaction occurring within the last 10 years:noIf all of the above answers are "NO", then may proceed with Cephalosporin use.      Home Medications  Prior to Admission medications   Medication Sig Start Date End Date Taking? Authorizing Provider  acetaminophen (TYLENOL) 650 MG CR tablet Take 650 mg by mouth every 8 (eight) hours as needed for pain (arthritis).    Yes [provider]  alprazolam Duanne Moron) 2 MG tablet Take 1 mg by mouth every 3 (three) hours.   Yes [provider]  amLODipine (NORVASC) 5 MG tablet Take 5 mg by mouth daily.    Yes [provider]  aspirin 81 MG tablet Take 81 mg by mouth daily.   Yes [provider]  atorvastatin (LIPITOR) 20 MG tablet Take 20 mg by mouth daily. 10/12/17  Yes [provider]  cholecalciferol (VITAMIN D3) 25 MCG (1000 UNIT) tablet Take 1,000 Units by mouth daily.   Yes [provider]  Coenzyme Q10 (CO Q 10) 100 MG CAPS Take 100 mg by mouth daily.   Yes [provider]  Fluorouracil (TOLAK) 4 % CREA Apply 1 application topically daily.    Yes [provider]  gabapentin (NEURONTIN) 100 MG capsule Take 100 mg by mouth daily as needed (Nerve pain).  10/25/19  Yes [provider]  gabapentin (NEURONTIN) 600 MG tablet Take 600 mg by mouth 3 (three) times daily.   11/08/19  Yes [provider]  hydrochlorothiazide (HYDRODIURIL) 25 MG tablet Take 25 mg by mouth daily.  08/11/12  Yes [provider]  losartan (COZAAR) 100 MG tablet Take 100 mg by mouth daily.   Yes [provider]  mirtazapine (REMERON) 45 MG tablet Take 45 mg by mouth at bedtime.   Yes [provider]  Multiple Vitamin (MULTIVITAMIN) tablet Take 1 tablet by mouth daily. Centrum Silver   Yes [provider]  pantoprazole (PROTONIX) 40 MG tablet Take 40 mg by mouth 2 (two) times daily.   Yes [provider]  Probiotic Product (ALIGN) 4 MG CAPS Take 4 mg by mouth daily.   Yes [provider]  tamsulosin (FLOMAX) 0.4 MG CAPS capsule Take 0.4 mg by mouth daily.  04/11/13  Yes [provider]  terazosin (HYTRIN) 2 MG capsule Take 2 mg by mouth at bedtime.    Yes [provider]  Turmeric 500 MG CAPS Take 500 mg by mouth daily.    Yes [provider]  Wheat Dextrin (BENEFIBER PO) Take 1 Scoop by mouth daily.    Yes [provider]  zinc gluconate 50 MG tablet Take 50 mg by mouth daily.   Yes [provider]  polyethylene glycol-electrolytes (TRILYTE) 420 g solution Take 4,000 mLs by mouth as directed. Patient not taking: Reported on 07/12/2020 06/24/20   Rourk, Cristopher Estimable, MD       Christinia Gully, MD [facility] [place] [phone]   After 7:00 pm call Elink  [phone]

## 2020-07-13 NOTE — Consult Note (Signed)
Referring Provider: Dr. Barb Merino  Primary Care Physician:  Redmond School, MD Primary Gastroenterologist:  Dr. Gala Romney   Reason for Consultation: Abdominal pain, IDA, weight loss  HPI: Lee Christensen is a 69 y.o. 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 pylori and GERD.  He presented to [facility] ED 07/12/2020 due to worsening abdominal pain.  In the ED, he was tachycardic and afebrile. Labs showed WBC 12.4. Hg 10.7. HCT 33.0. Normal LFTs.  An abdominal/pelvic CT scan showed multiple round ill-defined low-density lesions scattered throughout the liver highly suspicious for metastatic disease.  No definitive evidence of primary malignancy within the abdomen or pelvis was identified.  Colonic diverticulosis without evidence of acute diverticulitis and a small hiatal hernia were noted.  A chest CT showed a right upper lobe lung mass which extends to involve the right hilum consistent with primary lung malignancy and a 5 mm noncalcified left lower lobe and left basilar noncalcified lung nodule.  Oncologist Dr. Marin Olp was consulted and an EGD was recommended. A GI consult was requested for further evaluation regarding abdominal pain with IDA in setting of new diagnosis of lung cancer with metastasis.  He complains of having a decreased appetite for the past 3 to 5 months.  He was seen by his PCP due to having generalized mid to lower abdominal pain and reflux symptoms 3 to 4 months ago.  He reported a blood test was positive for H. pylori and he was prescribed 2 antibiotics which he took for 10 out of the prescribed 14 days.  His abdominal pain somewhat diminished but his poor appetite persisted.  His generalized mid to lower abdominal pain has recently worsened over the past few weeks.  He has frequent heartburn most days despite taking Pantoprazole twice daily.  No dysphagia.   He reports losing 25 to 30 pounds over the past 3 to 4 months with the last 10 pound of weight loss over the past few weeks.  No fever, sweats or chills.  No chest pain.  No shortness of breath but he has a chronic cough.  Sputum is usually yellowish but yesterday he coughed up brown-colored sputum.  No frank hemoptysis.  Prior smoker.  No alcohol use.  Passing normal formed brown bowel movement daily.  No rectal bleeding or melena.  Takes ASA 81 mg daily.  No other NSAIDs.  He underwent a colonoscopy by Dr. Gala Romney in 2014 which showed diverticulosis, no polyps.  We was scheduled to have an EGD and colonoscopy by Dr. Gala Romney on Wednesday  07/16/2020. No known family history of esophageal, gastric or colon cancer.  Mother had lung cancer.  His wife is present.  Laboratory studies 07/12/2020: BUN 12.  Creatinine 1.09.  Alk phos 81.  Albumin 2.9.  Lipase 22.  AST 25.  ALT 13.  Total bili 0.8.  WBC 12.4.  Hemoglobin 10.7 (bsseline Hg 13.5 on 01/02/2018).  Hematocrit 33.0.  MCV 88.9.  Platelets 607.  Influenza a and B negative.  SARS coronavirus 2 negative.  Laboratory studies 07/13/2020: Sodium 139.  Potassium 3.7.  Glucose 99.  BUN 9.  Creatinine 0.94.  Calcium 9.5.  Iron 20.  TIBC 236.  Ferritin 132.  WBC 9.0.  Hemoglobin 10.1.  Hematocrit 31.9.  MCV 89.1.  Platelet 535.  Abdominal/pelvic CT without contrast 07/13/2020: 1. Interval development of multiple rounded ill-defined low-density lesions scattered throughout the  liver highly suspicious for metastatic disease. No definite evidence of a primary malignancy within the abdomen or pelvis. 2. Colonic diverticulosis without evidence of acute diverticulitis. 3. Small hiatal hernia. 4. Aortic atherosclerosis  Chest CT 07/12/2020: 1. Posterior right upper lobe lung mass which extends to involve the right hilum, consistent with primary lung malignancy. 2. Numerous heterogeneous low-attenuation liver lesions, consistent with metastatic disease. 3. 5 mm  noncalcified left lower lobe and left basilar noncalcified lung nodule versus focal scar.  Colonoscopy 11/09/2012 by Dr. Gala Romney: Left-sided diverticulosis Call colonoscopy 10 years  Past Medical History:  Diagnosis Date  . Anemia   . Arthritis   . Bilateral inguinal hernia (BIH) s/p lap repair 12/29/2012 12/04/2012  . Chronic back pain   . Complication of anesthesia    slow to awaken after hernia surgery  . Diabetes mellitus without complication (HCC)    Borderline.  . Diverticulosis   . GERD (gastroesophageal reflux disease)   . Hemangioma of left chest wall s/p excision 12/29/2012 01/17/2013  . Hemangioma of left chest wall s/p excision 12/29/2012 01/17/2013  . Hypertension   . Knee pain   . Squamous cell carcinoma in situ (SCCIS) 09/18/2019   Left Shoulder (curet and 5FU)  . Squamous cell carcinoma in situ (SCCIS) 10/25/2019   Left Post Shoulder (treatment after biopsy)  . Squamous cell carcinoma in situ (SCCIS) 10/25/2019   Medial Scalp  . Squamous cell carcinoma in situ (SCCIS) 10/25/2019   Front Mid Scalp  . Squamous cell carcinoma of skin 10/25/2019   in situ-left post shoulder (txpbx), in situ-medial scalp- (CX35FU), in situ-front mid scalp- (CX35FU)  . Squamous cell carcinoma of skin 09/18/2019   in situ-Left shoulder-(CX35FU)    Past Surgical History:  Procedure Laterality Date  . CATARACT EXTRACTION W/ INTRAOCULAR LENS  IMPLANT, BILATERAL  1996  . COLONOSCOPY  04/03/01   SPQ:[id number] hemorrhoids otherwise normal  . COLONOSCOPY N/A 11/09/2012   Dr. Gala Romney- normal rectum, scattered left-sided diverticula, the remainder of colonic mucosa appeared normal.  . ESOPHAGOGASTRODUODENOSCOPY  06/25/03   QJF:[id number] colored tongue-NO BARRETTs (EROSIVE ESOPHAGITIS)/small HH  . HERNIA REPAIR     ? lower abd  . INSERTION OF MESH Bilateral 12/29/2012   Procedure: INSERTION OF MESH;  Surgeon: Adin Hector, MD;  Location: [facility];  Service: General;  Laterality: Bilateral;  . KNEE  SURGERY     left x 2  . LAPAROSCOPIC INGUINAL HERNIA WITH UMBILICAL HERNIA Bilateral 12/29/2012   Procedure: LAPAROSCOPIC INGUINAL HERNIA WITH UMBILICAL HERNIA;  Surgeon: Adin Hector, MD;  Location: [facility];  Service: General;  Laterality: Bilateral;  . MASS EXCISION Left 12/29/2012   Procedure: EXCISION MASS LEFT CHEST WALL;  Surgeon: Adin Hector, MD;  Location: [facility];  Service: General;  Laterality: Left;  . WISDOM TOOTH EXTRACTION      Prior to Admission medications   Medication Sig Start Date End Date Taking? Authorizing Provider  acetaminophen (TYLENOL) 650 MG CR tablet Take 650 mg by mouth every 8 (eight) hours as needed for pain (arthritis).    Yes [provider]  alprazolam Duanne Moron) 2 MG tablet Take 1 mg by mouth every 3 (three) hours.   Yes [provider]  amLODipine (NORVASC) 5 MG tablet Take 5 mg by mouth daily.    Yes [provider]  aspirin 81 MG tablet Take 81 mg by mouth daily.   Yes [provider]  atorvastatin (LIPITOR) 20 MG tablet Take 20 mg by mouth daily. 10/12/17  Yes [provider]  cholecalciferol (VITAMIN D3) 25 MCG (1000 UNIT) tablet Take 1,000 Units by mouth daily.   Yes [provider]  Coenzyme Q10 (CO Q 10) 100 MG CAPS Take 100 mg by mouth daily.   Yes [provider]  Fluorouracil (TOLAK) 4 % CREA Apply 1 application topically daily.    Yes [provider]  gabapentin (NEURONTIN) 100 MG capsule Take 100 mg by mouth daily as needed (Nerve pain).  10/25/19  Yes [provider]  gabapentin (NEURONTIN) 600 MG tablet Take 600 mg by mouth 3 (three) times daily.  11/08/19  Yes [provider]  hydrochlorothiazide (HYDRODIURIL) 25 MG tablet Take 25 mg by mouth daily.  08/11/12  Yes [provider]  losartan (COZAAR) 100 MG tablet Take 100 mg by mouth daily.   Yes [provider]  mirtazapine (REMERON) 45 MG tablet Take 45 mg by mouth at bedtime.   Yes  [provider]  Multiple Vitamin (MULTIVITAMIN) tablet Take 1 tablet by mouth daily. Centrum Silver   Yes [provider]  pantoprazole (PROTONIX) 40 MG tablet Take 40 mg by mouth 2 (two) times daily.   Yes [provider]  Probiotic Product (ALIGN) 4 MG CAPS Take 4 mg by mouth daily.   Yes [provider]  tamsulosin (FLOMAX) 0.4 MG CAPS capsule Take 0.4 mg by mouth daily.  04/11/13  Yes [provider]  terazosin (HYTRIN) 2 MG capsule Take 2 mg by mouth at bedtime.    Yes [provider]  Turmeric 500 MG CAPS Take 500 mg by mouth daily.    Yes [provider]  Wheat Dextrin (BENEFIBER PO) Take 1 Scoop by mouth daily.    Yes [provider]  zinc gluconate 50 MG tablet Take 50 mg by mouth daily.   Yes [provider]  polyethylene glycol-electrolytes (TRILYTE) 420 g solution Take 4,000 mLs by mouth as directed. Patient not taking: Reported on 07/12/2020 06/24/20   Rourk, Cristopher Estimable, MD    Current Facility-Administered Medications  Medication Dose Route Frequency Provider Last Rate Last Admin  . acetaminophen (TYLENOL) tablet 650 mg  650 mg Oral Q6H PRN Harold Hedge, MD       Or  . acetaminophen (TYLENOL) suppository 650 mg  650 mg Rectal Q6H PRN Harold Hedge, MD      . ALPRAZolam Duanne Moron) tablet 1 mg  1 mg Oral QID PRN Barb Merino, MD      . amLODipine (NORVASC) tablet 5 mg  5 mg Oral Daily Harold Hedge, MD   5 mg at 07/13/20 1004  . atorvastatin (LIPITOR) tablet 20 mg  20 mg Oral Daily Harold Hedge, MD   20 mg at 07/13/20 1004  . enoxaparin (LOVENOX) injection 40 mg  40 mg Subcutaneous Q24H Harold Hedge, MD   40 mg at 07/12/20 1842  . hydrochlorothiazide (HYDRODIURIL) tablet 25 mg  25 mg Oral Daily Harold Hedge, MD   25 mg at 07/13/20 1003  . HYDROcodone-acetaminophen (NORCO/VICODIN) 5-325 MG per tablet 1-2 tablet  1-2 tablet Oral Q4H PRN Harold Hedge, MD   2 tablet at 07/13/20 0913  . losartan  (COZAAR) tablet 100 mg  100 mg Oral Daily Harold Hedge, MD   100 mg at 07/13/20 1003  . mirtazapine (REMERON) tablet 45 mg  45 mg Oral QHS Harold Hedge, MD   45 mg at 07/12/20 2136  . morphine 2 MG/ML injection 2  mg  2 mg Intravenous Q2H PRN Harold Hedge, MD   2 mg at 07/13/20 1004  . ondansetron (ZOFRAN) tablet 4 mg  4 mg Oral Q6H PRN Harold Hedge, MD       Or  . ondansetron [facility]) injection 4 mg  4 mg Intravenous Q6H PRN Harold Hedge, MD      . pantoprazole (PROTONIX) EC tablet 40 mg  40 mg Oral BID Harold Hedge, MD   40 mg at 07/13/20 1003  . phenol (CHLORASEPTIC) mouth spray 1 spray  1 spray Mouth/Throat PRN Harold Hedge, MD      . polyethylene glycol (MIRALAX / GLYCOLAX) packet 17 g  17 g Oral Daily PRN Harold Hedge, MD      . terazosin (HYTRIN) capsule 2 mg  2 mg Oral QHS Harold Hedge, MD   2 mg at 07/12/20 2216    Allergies as of 07/12/2020 - Review Complete 07/12/2020  Allergen Reaction Noted  . Penicillins Rash 10/31/2012    Family History  Problem Relation Age of Onset  . Prostate cancer Father   . Diabetes Father   . Hypertension Father   . Stroke Father   . Lung cancer Mother     Social History   Socioeconomic History  . Marital status: Married    Spouse name: Not on file  . Number of children: 2  . Years of education: Not on file  . Highest education level: Not on file  Occupational History  . Occupation: retired; [employer]  . Smoking status: Former Smoker    Packs/day: 3.00    Years: 30.00    Pack years: 90.00    Types: Cigarettes    Quit date: 08/16/1996    Years since quitting: 23.9  . Smokeless tobacco: Former Systems developer    Types: Chew    Quit date: 11/01/1994  Vaping Use  . Vaping Use: Never used  Substance and Sexual Activity  . Alcohol use: Yes    Alcohol/week: 1.0 standard drink    Types: 1 Cans of beer per week    Comment: Rarely.  . Drug use: No  . Sexual activity: Not on file  Other Topics Concern  . Not  on file  Social History Narrative   Lives w/ wife   Social Determinants of Health   Financial Resource Strain:   . Difficulty of Paying Living Expenses: Not on file  Food Insecurity:   . Worried About Charity fundraiser in the Last Year: Not on file  . Ran Out of Food in the Last Year: Not on file  Transportation Needs:   . Lack of Transportation (Medical): Not on file  . Lack of Transportation (Non-Medical): Not on file  Physical Activity:   . Days of Exercise per Week: Not on file  . Minutes of Exercise per Session: Not on file  Stress:   . Feeling of Stress : Not on file  Social Connections:   . Frequency of Communication with Friends and Family: Not on file  . Frequency of Social Gatherings with Friends and Family: Not on file  . Attends Religious Services: Not on file  . Active Member of Clubs or Organizations: Not on file  . Attends Archivist Meetings: Not on file  . Marital Status: Not on file  Intimate Partner Violence:   . Fear of Current or Ex-Partner: Not on file  . Emotionally Abused: Not on file  . Physically  Abused: Not on file  . Sexually Abused: Not on file    Review of Systems: Gen: See HPI. CV: Denies chest pain, palpitations or edema. Resp: Productive cough, sputum was brownish  yesterday.  No frank hemoptysis. GI: See HPI. GU : Denies urinary burning, blood in urine, increased urinary frequency or incontinence. MS: Denies joint pain, muscles aches or weakness. Derm: Denies rash, itchiness, skin lesions or unhealing ulcers. Psych: Denies depression, anxiety or memory loss. Heme: Denies easy bruising, bleeding. Neuro:  Denies headaches, dizziness or paresthesias. Endo:  No current diabetes.  Physical Exam: Vital signs in last 24 hours: Temp:  [98.2 F (36.8 C)-98.7 F (37.1 C)] 98.2 F (36.8 C) (11/28 0543) Pulse Rate:  [71-115] 87 (11/28 0543) Resp:  [15-21] 17 (11/28 0543) BP: (94-150)/(64-84) 117/64 (11/28 0543) SpO2:  [94 %-99  %] 95 % (11/28 0543) Last BM Date: 07/12/20 General:  Alert 69 year old male in no acute distress. Head:  Normocephalic and atraumatic. Eyes:  No scleral icterus. Conjunctiva pink. Ears:  Normal auditory acuity. Nose:  No deformity, discharge or lesions. Mouth:  Dentition intact.  Brown coating on tongue, glossitis.  No ulcers or lesions.  Neck:  Supple. No lymphadenopathy or thyromegaly.  Lungs: Breath sounds clear throughout. Heart: Regular rate and rhythm, no murmurs. Abdomen: Soft, nondistended.  Nontender.  Positive bowel sounds to all 4 quadrants. Rectal: Deferred. Musculoskeletal:  Symmetrical without gross deformities.  Pulses:  Normal pulses noted. Extremities:  Without clubbing or edema. Neurologic:  Alert and  oriented x4. No focal deficits.  Skin:  Intact without significant lesions or rashes. Psych:  Alert and cooperative. Normal mood and affect.  Intake/Output from previous day: 11/27 0701 - 11/28 0700 In: 936.1 [P.O.:370; IV Piggyback:566.1] Out: 800 [Urine:800] Intake/Output this shift: Total I/O In: 120 [P.O.:120] Out: -   Lab Results: Recent Labs    07/12/20 1339 07/13/20 0602  WBC 12.4* 9.0  HGB 10.7* 10.1*  HCT 33.0* 31.9*  PLT 607* 535*   BMET Recent Labs    07/12/20 1339 07/13/20 0602  NA 137 139  K 3.8 3.7  CL 102 105  CO2 28 24  GLUCOSE 176* 99  BUN 12 9  CREATININE 1.09 0.94  CALCIUM 9.2 9.5   LFT Recent Labs    07/12/20 1339  PROT 7.1  ALBUMIN 2.9*  AST 25  ALT 13  ALKPHOS 81  BILITOT 0.8   PT/INR No results for input(s): LABPROT, INR in the last 72 hours. Hepatitis Panel No results for input(s): HEPBSAG, HCVAB, HEPAIGM, HEPBIGM in the last 72 hours.    Studies/Results: CT ABDOMEN PELVIS WO CONTRAST  Result Date: 07/12/2020 CLINICAL DATA:  Abdominal pain EXAM: CT ABDOMEN AND PELVIS WITHOUT CONTRAST TECHNIQUE: Multidetector CT imaging of the abdomen and pelvis was performed following the standard protocol without IV  contrast. COMPARISON:  10/21/2017 FINDINGS: Lower chest: Prominent subpleural fat again noted overlying the lateral aspect of the right lower lobe. Lung bases are clear. Heart size is normal. Hepatobiliary: Interval development of multiple rounded ill-defined low-density lesions scattered throughout the liver highly suspicious for metastatic disease. Unremarkable appearance of the gallbladder. No hyperdense stone or biliary dilatation. Pancreas: Unremarkable. No pancreatic ductal dilatation or surrounding inflammatory changes. Spleen: Normal in size without focal abnormality. Adrenals/Urinary Tract: Unremarkable adrenal glands. Bilateral kidneys are within normal limits. No renal stone or hydronephrosis. Nonspecific bilateral perinephric stranding is similar to prior. Urinary bladder is within normal limits. Stomach/Bowel: Evaluation of the bowel is somewhat limited in the absence of  oral or intravenous contrast. Small hiatal hernia. Stomach otherwise appears within normal limits. There are no dilated loops of small bowel. Scattered colonic diverticulosis. No focal bowel wall thickening or inflammatory changes. A normal appendix is seen adjacent to the tip of the right hepatic lobe (series 4, image 62). Vascular/Lymphatic: Scattered aortoiliac atherosclerotic calcifications without aneurysm. No abdominopelvic lymphadenopathy. Reproductive: Prostate gland is within normal limits. Other: No free fluid. No abdominopelvic fluid collection. No pneumoperitoneum. No abdominal wall hernia. Musculoskeletal: No acute osseous abnormality. No suspicious bone lesion. IMPRESSION: 1. Interval development of multiple rounded ill-defined low-density lesions scattered throughout the liver highly suspicious for metastatic disease. No definite evidence of a primary malignancy within the abdomen or pelvis. 2. Colonic diverticulosis without evidence of acute diverticulitis. 3. Small hiatal hernia. 4. Aortic atherosclerosis.  (ICD10-I70.0). Electronically Signed   By: Davina Poke D.O.   On: 07/12/2020 14:33   CT CHEST W CONTRAST  Result Date: 07/12/2020 CLINICAL DATA:  Cancer of unknown primary. EXAM: CT CHEST WITH CONTRAST TECHNIQUE: Multidetector CT imaging of the chest was performed during intravenous contrast administration. CONTRAST:  40m OMNIPAQUE IOHEXOL 300 MG/ML  SOLN COMPARISON:  None. FINDINGS: Cardiovascular: No significant vascular findings. Normal heart size. No pericardial effusion. Mediastinum/Nodes: 2.5 cm x 1.5 cm and 2.1 cm x 1.4 cm pretracheal lymph nodes are seen. Thyroid gland, trachea, and esophagus demonstrate no significant findings. Lungs/Pleura: A 7.6 cm x 5.2 cm heterogeneous low-attenuation mass is seen within the posterior aspect of the right upper lobe. This extends to involve the right hilum. A 5 mm noncalcified lung nodule is seen within the posterior aspect of the left lung base (axial CT image 125, CT series number 5). A 5 mm ill-defined noncalcified lung nodule versus focal scar seen within the lateral aspect of the left lower lobe (axial CT image 107, CT series number 5). There is no evidence of a pleural effusion or pneumothorax. A small pleural based area of fat attenuation is seen along the anterolateral aspect of the mid right lung. Upper Abdomen: Numerous heterogeneous low-attenuation liver lesions are seen scattered throughout the right and left lobes of the liver. The largest measures approximately 2.0 cm x 1.7 cm and is seen near the caudate lobe. There is a small hiatal hernia. Musculoskeletal: No chest wall abnormality. No acute or significant osseous findings. IMPRESSION: 1. Posterior right upper lobe lung mass which extends to involve the right hilum, consistent with primary lung malignancy. 2. Numerous heterogeneous low-attenuation liver lesions, consistent with metastatic disease. 3. 5 mm noncalcified left lower lobe and left basilar noncalcified lung nodule versus focal scar.  Electronically Signed   By: TVirgina NorfolkM.D.   On: 07/12/2020 17:43   DG Chest Portable 1 View  Result Date: 07/12/2020 CLINICAL DATA:  Cough EXAM: PORTABLE CHEST 1 VIEW COMPARISON:  Chest x-ray 01/02/2018 FINDINGS: The heart size and mediastinal contours are within normal limits. Interval development of a right upper lobe airspace opacity. Suggestion of thickened right paratracheal stripe. No pulmonary edema. No pleural effusion. No pneumothorax. No acute osseous abnormality. IMPRESSION: Interval development of right upper lobe airspace opacity and suggestion of thickened right paratracheal stripe. Finding could represent infection/inflammation with underlying malignancy not excluded. Recommend CT chest with intravenous contrast for further evaluation. Electronically Signed   By: MIven FinnM.D.   On: 07/12/2020 15:29    IMPRESSION/PLAN:  175  69year old male with primary lung cancer with hepatic metastasis in the setting of worsening generalized mid to lower abdominal pain and weight  loss.  Chest CT 11/27 identified a 7.6 x 5.2 right lung mass consistent with primary malignancy. CTAP 11/27 showed evidence of hepatic metastasis.  EGD requested by Dr. Marin Olp prior to initiating cancer treatment.  He is potentially scheduled for a bronchoscopy and lung biopsy tomorrow. -EGD +/-colonoscopy (last colonoscopy was in 2014). EGD and colonoscopy benefits and risks discussed including risk with sedation, risk of bleeding, perforation and infection.  Timing of the EGD +/-  colonoscopy to be determined after bronchoscopy procedure time and date verified. Further recommendations per Dr. Bryan Lemma.  -Clear liquids today -BMP and CBC in a.m.  2.  IDA -See plan in #1  3.  GERD. Recently  treated for + H. Pylori ab. -Continue Pantoprazole 40 mg p.o. twice daily  4. Past smoker   Noralyn Pick  07/13/2020, 12:22 PM

## 2020-07-13 NOTE — Progress Notes (Signed)
PROGRESS NOTE    Lee Christensen  CLE:[id number] DOB: Dec 01, 1950 DOA: 07/12/2020 PCP: Lee School, MD    Brief Narrative:  69 year old gentleman with history of hypertension, diabetes, chronic back pain, recent H. pylori and diagnosis of GERD presented to the ER with intermittent right upper quadrant abdominal pain, low back pain and weight loss of more than 30 pounds in last 2 to 3 months.  Poor appetite.  Patient was a scheduled for EGD and colonoscopy for work-up of weight loss and poor appetite next week.  In the emergency room, afebrile.  Hemodynamically stable.  On room air.  CT scan chest abdomen pelvis showed 7 cm right upper lobe tumor along with numerous metastatic tumor on his liver.  Admitted for symptom control and diagnosis.   Assessment & Plan:   Principal Problem:   Pain management Active Problems:   Abdominal pain   Back pain   GERD (gastroesophageal reflux disease)   Liver mass   Lung mass   Metastatic cancer (HCC)  Suspected bronchogenic cancer with liver metastasis.  Metastatic pain. -Followed by oncology. -Adequate pain management, will escalate oral opiates for pain control.  Continue IV morphine until good pain control is achieved. -Called and consulted pulmonary for bronchoscopy and biopsy. -Discussed, consult placed for gastroenterology as requested by oncology for upper GI evaluation. -Probable home with tissue biopsy and follow-up with oncology for results.  Failure to thrive.  Cancer anorexia: -Nutrition to see. -May need upper GI endoscopy to evaluate for any strictures or GI tumor.  Anxiety/insomnia: Continue chronic benzo therapy.  Hypertension: Stable on hydrochlorothiazide losartan.     DVT prophylaxis: enoxaparin (LOVENOX) injection 40 mg Start: 07/12/20 1845   Code Status: Full code Family Communication: Lee Christensen at the bedside Disposition Plan: Status is: Inpatient  Remains inpatient appropriate because:Inpatient level of care appropriate  due to severity of illness   Dispo: The patient is from: Home              Anticipated d/c is to: Home              Anticipated d/c date is: 2 days              Patient currently is not medically stable to d/c.  Patient admitted with new diagnosis of metastatic cancer, cancer related pain needing IV opiates for pain control.  He will also need inpatient investigations to come to diagnosis and formulate treatment plan.  He will need more than 2 midnight hospitalization.       Consultants:   Palliative medicine  Oncology  Gastroenterology  Procedures:   None  Antimicrobials:   None   Subjective: Patient seen and examined.  Lee Christensen was at the bedside.  Patient complained of low back pain and good improvement with IV morphine.  Denies any shortness of breath.  Denies any chest pain.  He has no appetite.  Everything he tries to eat that hurts his belly. His anxiety is to the roof and he wants something to get him good sleep at night.  Objective: Vitals:   07/12/20 1744 07/12/20 2203 07/13/20 0158 07/13/20 0543  BP: 131/72 105/70 129/67 117/64  Pulse: 82 71 81 87  Resp: 16 17 15 17   Temp: 98.7 F (37.1 C) 98.2 F (36.8 C) 98.4 F (36.9 C) 98.2 F (36.8 C)  TempSrc:    Oral  SpO2: 98% 94% 97% 95%  Height: 5\' 7"  (1.702 m)       Intake/Output Summary (Last 24 hours) at  07/13/2020 1316 Last data filed at 07/13/2020 0934 Gross per 24 hour  Intake 1056.1 ml  Output 800 ml  Net 256.1 ml   There were no vitals filed for this visit.  Examination:  General exam: Chronically sick looking.  Anxious. Thin and frail.  Not in any distress. Respiratory system: Clear to auscultation. Respiratory effort normal.  No added sounds. Cardiovascular system: S1 & S2 heard, RRR.  Gastrointestinal system: Soft.  Nontender.   Central nervous system: Alert and oriented. No focal neurological deficits. Extremities: Symmetric 5 x 5 power. Skin: No rashes, lesions or ulcers Psychiatry:  Judgement and insight appear normal. Mood & affect anxious and flat.     Data Reviewed: I have personally reviewed following labs and imaging studies  CBC: Recent Labs  Lab 07/12/20 1339 07/13/20 0602  WBC 12.4* 9.0  NEUTROABS 10.5*  --   HGB 10.7* 10.1*  HCT 33.0* 31.9*  MCV 88.9 89.1  PLT 607* 867*   Basic Metabolic Panel: Recent Labs  Lab 07/12/20 1339 07/13/20 0602  NA 137 139  K 3.8 3.7  CL 102 105  CO2 28 24  GLUCOSE 176* 99  BUN 12 9  CREATININE 1.09 0.94  CALCIUM 9.2 9.5   GFR: CrCl cannot be calculated (Unknown ideal weight.). Liver Function Tests: Recent Labs  Lab 07/12/20 1339  AST 25  ALT 13  ALKPHOS 81  BILITOT 0.8  PROT 7.1  ALBUMIN 2.9*   Recent Labs  Lab 07/12/20 1339  LIPASE 22   No results for input(s): AMMONIA in the last 168 hours. Coagulation Profile: No results for input(s): INR, PROTIME in the last 168 hours. Cardiac Enzymes: No results for input(s): CKTOTAL, CKMB, CKMBINDEX, TROPONINI in the last 168 hours. BNP (last 3 results) No results for input(s): PROBNP in the last 8760 hours. HbA1C: No results for input(s): HGBA1C in the last 72 hours. CBG: No results for input(s): GLUCAP in the last 168 hours. Lipid Profile: No results for input(s): CHOL, HDL, LDLCALC, TRIG, CHOLHDL, LDLDIRECT in the last 72 hours. Thyroid Function Tests: No results for input(s): TSH, T4TOTAL, FREET4, T3FREE, THYROIDAB in the last 72 hours. Anemia Panel: Recent Labs    07/13/20 0602  FERRITIN 132  TIBC 236*  IRON 20*   Sepsis Labs: No results for input(s): PROCALCITON, LATICACIDVEN in the last 168 hours.  Recent Results (from the past 240 hour(s))  Resp Panel by RT-PCR (Flu A&B, Covid) Nasopharyngeal Swab     Status: None   Collection Time: 07/12/20  3:26 PM   Specimen: Nasopharyngeal Swab; Nasopharyngeal(NP) swabs in vial transport medium  Result Value Ref Range Status   SARS Coronavirus 2 by RT PCR NEGATIVE NEGATIVE Final    Comment:  (NOTE) SARS-CoV-2 target nucleic acids are NOT DETECTED.  The SARS-CoV-2 RNA is generally detectable in upper respiratory specimens during the acute phase of infection. The lowest concentration of SARS-CoV-2 viral copies this assay can detect is 138 copies/mL. A negative result does not preclude SARS-Cov-2 infection and should not be used as the sole basis for treatment or other patient management decisions. A negative result may occur with  improper specimen collection/handling, submission of specimen other than nasopharyngeal swab, presence of viral mutation(s) within the areas targeted by this assay, and inadequate number of viral copies(<138 copies/mL). A negative result must be combined with clinical observations, patient history, and epidemiological information. The expected result is Negative.  Fact Sheet for Patients:  EntrepreneurPulse.com.au  Fact Sheet for Healthcare Providers:  IncredibleEmployment.be  This  test is no t yet approved or cleared by the [place] and  has been authorized for detection and/or diagnosis of SARS-CoV-2 by FDA under an Emergency Use Authorization (EUA). This EUA will remain  in effect (meaning this test can be used) for the duration of the COVID-19 declaration under Section 564(b)(1) of the Act, 21 U.S.C.section 360bbb-3(b)(1), unless the authorization is terminated  or revoked sooner.       Influenza A by PCR NEGATIVE NEGATIVE Final   Influenza B by PCR NEGATIVE NEGATIVE Final    Comment: (NOTE) The Xpert Xpress SARS-CoV-2/FLU/RSV plus assay is intended as an aid in the diagnosis of influenza from Nasopharyngeal swab specimens and should not be used as a sole basis for treatment. Nasal washings and aspirates are unacceptable for Xpert Xpress SARS-CoV-2/FLU/RSV testing.  Fact Sheet for Patients: EntrepreneurPulse.com.au  Fact Sheet for Healthcare  Providers: IncredibleEmployment.be  This test is not yet approved or cleared by the [place] FDA and has been authorized for detection and/or diagnosis of SARS-CoV-2 by FDA under an Emergency Use Authorization (EUA). This EUA will remain in effect (meaning this test can be used) for the duration of the COVID-19 declaration under Section 564(b)(1) of the Act, 21 U.S.C. section 360bbb-3(b)(1), unless the authorization is terminated or revoked.  Performed at [facility], [place] [address] [place],  [address]          Radiology Studies: CT ABDOMEN PELVIS WO CONTRAST  Result Date: 07/12/2020 CLINICAL DATA:  Abdominal pain EXAM: CT ABDOMEN AND PELVIS WITHOUT CONTRAST TECHNIQUE: Multidetector CT imaging of the abdomen and pelvis was performed following the standard protocol without IV contrast. COMPARISON:  10/21/2017 FINDINGS: Lower chest: Prominent subpleural fat again noted overlying the lateral aspect of the right lower lobe. Lung bases are clear. Heart size is normal. Hepatobiliary: Interval development of multiple rounded ill-defined low-density lesions scattered throughout the liver highly suspicious for metastatic disease. Unremarkable appearance of the gallbladder. No hyperdense stone or biliary dilatation. Pancreas: Unremarkable. No pancreatic ductal dilatation or surrounding inflammatory changes. Spleen: Normal in size without focal abnormality. Adrenals/Urinary Tract: Unremarkable adrenal glands. Bilateral kidneys are within normal limits. No renal stone or hydronephrosis. Nonspecific bilateral perinephric stranding is similar to prior. Urinary bladder is within normal limits. Stomach/Bowel: Evaluation of the bowel is somewhat limited in the absence of oral or intravenous contrast. Small hiatal hernia. Stomach otherwise appears within normal limits. There are no dilated loops of small bowel. Scattered colonic diverticulosis. No focal bowel  wall thickening or inflammatory changes. A normal appendix is seen adjacent to the tip of the right hepatic lobe (series 4, image 62). Vascular/Lymphatic: Scattered aortoiliac atherosclerotic calcifications without aneurysm. No abdominopelvic lymphadenopathy. Reproductive: Prostate gland is within normal limits. Other: No free fluid. No abdominopelvic fluid collection. No pneumoperitoneum. No abdominal wall hernia. Musculoskeletal: No acute osseous abnormality. No suspicious bone lesion. IMPRESSION: 1. Interval development of multiple rounded ill-defined low-density lesions scattered throughout the liver highly suspicious for metastatic disease. No definite evidence of a primary malignancy within the abdomen or pelvis. 2. Colonic diverticulosis without evidence of acute diverticulitis. 3. Small hiatal hernia. 4. Aortic atherosclerosis. (ICD10-I70.0). Electronically Signed   By: Davina Poke D.O.   On: 07/12/2020 14:33   CT CHEST W CONTRAST  Result Date: 07/12/2020 CLINICAL DATA:  Cancer of unknown primary. EXAM: CT CHEST WITH CONTRAST TECHNIQUE: Multidetector CT imaging of the chest was performed during intravenous contrast administration. CONTRAST:  1mL OMNIPAQUE IOHEXOL 300 MG/ML  SOLN COMPARISON:  None.  FINDINGS: Cardiovascular: No significant vascular findings. Normal heart size. No pericardial effusion. Mediastinum/Nodes: 2.5 cm x 1.5 cm and 2.1 cm x 1.4 cm pretracheal lymph nodes are seen. Thyroid gland, trachea, and esophagus demonstrate no significant findings. Lungs/Pleura: A 7.6 cm x 5.2 cm heterogeneous low-attenuation mass is seen within the posterior aspect of the right upper lobe. This extends to involve the right hilum. A 5 mm noncalcified lung nodule is seen within the posterior aspect of the left lung base (axial CT image 125, CT series number 5). A 5 mm ill-defined noncalcified lung nodule versus focal scar seen within the lateral aspect of the left lower lobe (axial CT image 107, CT  series number 5). There is no evidence of a pleural effusion or pneumothorax. A small pleural based area of fat attenuation is seen along the anterolateral aspect of the mid right lung. Upper Abdomen: Numerous heterogeneous low-attenuation liver lesions are seen scattered throughout the right and left lobes of the liver. The largest measures approximately 2.0 cm x 1.7 cm and is seen near the caudate lobe. There is a small hiatal hernia. Musculoskeletal: No chest wall abnormality. No acute or significant osseous findings. IMPRESSION: 1. Posterior right upper lobe lung mass which extends to involve the right hilum, consistent with primary lung malignancy. 2. Numerous heterogeneous low-attenuation liver lesions, consistent with metastatic disease. 3. 5 mm noncalcified left lower lobe and left basilar noncalcified lung nodule versus focal scar. Electronically Signed   By: Virgina Norfolk M.D.   On: 07/12/2020 17:43   DG Chest Portable 1 View  Result Date: 07/12/2020 CLINICAL DATA:  Cough EXAM: PORTABLE CHEST 1 VIEW COMPARISON:  Chest x-ray 01/02/2018 FINDINGS: The heart size and mediastinal contours are within normal limits. Interval development of a right upper lobe airspace opacity. Suggestion of thickened right paratracheal stripe. No pulmonary edema. No pleural effusion. No pneumothorax. No acute osseous abnormality. IMPRESSION: Interval development of right upper lobe airspace opacity and suggestion of thickened right paratracheal stripe. Finding could represent infection/inflammation with underlying malignancy not excluded. Recommend CT chest with intravenous contrast for further evaluation. Electronically Signed   By: Iven Finn M.D.   On: 07/12/2020 15:29        Scheduled Meds: . amLODipine  5 mg Oral Daily  . atorvastatin  20 mg Oral Daily  . enoxaparin (LOVENOX) injection  40 mg Subcutaneous Q24H  . hydrochlorothiazide  25 mg Oral Daily  . losartan  100 mg Oral Daily  . mirtazapine  45  mg Oral QHS  . pantoprazole  40 mg Oral BID  . terazosin  2 mg Oral QHS   Continuous Infusions:   LOS: 0 days    Time spent: 35 minutes     Barb Merino, MD [facility] Pager [phone]

## 2020-07-13 NOTE — Consult Note (Signed)
Consultation Note Date: 07/13/2020   Patient Name: Lee Christensen  DOB: 04-04-1951  MRN: [id number]  Age / Sex: 69 y.o., male  PCP: Redmond School, MD Referring Physician: Barb Merino, MD  Reason for Consultation: Establishing goals of care 69 year old gentleman who lives at home with his wife, has 2 daughters, has been having intermittent abdominal pain for which she was under work-up in the outpatient setting, admitted to the hospital with intractable pain and upon further imaging found to have right upper lobe lung mass, questionable metastatic disease to the liver.  Has been seen by pulmonology and medical oncology, remains admitted to hospital medicine service, palliative consult for symptom management and additional support and early goals of care discussions has been requested.  HPI/Patient Profile: 69 y.o. male  admitted on 07/12/2020  Patient is awake alert resting in bed.  His wife is present at the bedside.  I introduced myself and palliative care as follows:  Palliative medicine is specialized medical care for people living with serious illness. It focuses on providing relief from the symptoms and stress of a serious illness. The goal is to improve quality of life for both the patient and the family.  Goals of care: Broad aims of medical therapy in relation to the patient's values and preferences. Our aim is to provide medical care aimed at enabling patients to achieve the goals that matter most to them, given the circumstances of their particular medical situation and their constraints.   Clinical Assessment and Goals of Care:  Discussed with the patient and his wife about scope of current hospitalization.  Brief life review performed.  Patient states that he has been having some weight loss and abdominal discomfort.  He is aware of CT scan showing mass in the lung.  Goals wishes and values important  to the patient and family as a unit attempted to be explored.  Patient's wife is tearful, states that patient's 2 daughters would like to come to the hospital to see him even if it is for a very short period of time.  She states that they are all in shock because of the patient's current diagnosis.  Offered active listening, empathic presence and supportive care.  Discussed extensively about pain and nonpain symptom management.  Patient states that he has a history of anxiety and panic attacks, takes Xanax at home.  He complains of back pain, he states that between the IV morphine as needed and the p.o. Vicodin as needed his pain is for the large part controlled.  He has had marked decline in appetite and oral intake.   NEXT OF KIN Wife and 2 daughters  SUMMARY OF RECOMMENDATIONS   Full code/full scope care for now.  Patient states he does not have any formal advanced directives paperwork done.  As per patient and family request, will request chaplain follow-up for completion of advance directives. Would request liberalization of visitors so that patient's 2 daughters can come up and see him at least for a short while as the patient and  family grapple with the news of the serious diagnosis. Continue current pain and nonpain symptom management, follow hospital course to help guide further goals of care discussions and appropriate disposition planning. Thank you for the consult.    Code Status/Advance Care Planning:  Full code    Symptom Management:    as above   Palliative Prophylaxis:   Delirium Protocol  Additional Recommendations (Limitations, Scope, Preferences):  Full Scope Treatment  Psycho-social/Spiritual:   Desire for further Chaplaincy support:yes  Additional Recommendations: Caregiving  Support/Resources  Prognosis:   Unable to determine  Discharge Planning: To Be Determined      Primary Diagnoses: Present on Admission: **None**   I have reviewed the medical  record, interviewed the patient and family, and examined the patient. The following aspects are pertinent.  Past Medical History:  Diagnosis Date   Anemia    Arthritis    Bilateral inguinal hernia (BIH) s/p lap repair 12/29/2012 12/04/2012   Chronic back pain    Complication of anesthesia    slow to awaken after hernia surgery   Diabetes mellitus without complication ([place])    Borderline.   Diverticulosis    GERD (gastroesophageal reflux disease)    Hemangioma of left chest wall s/p excision 12/29/2012 01/17/2013   Hemangioma of left chest wall s/p excision 12/29/2012 01/17/2013   Hypertension    Knee pain    Squamous cell carcinoma in situ (SCCIS) 09/18/2019   Left Shoulder (curet and 5FU)   Squamous cell carcinoma in situ (SCCIS) 10/25/2019   Left Post Shoulder (treatment after biopsy)   Squamous cell carcinoma in situ (SCCIS) 10/25/2019   Medial Scalp   Squamous cell carcinoma in situ (SCCIS) 10/25/2019   Front Mid Scalp   Squamous cell carcinoma of skin 10/25/2019   in situ-left post shoulder (txpbx), in situ-medial scalp- (CX35FU), in situ-front mid scalp- (CX35FU)   Squamous cell carcinoma of skin 09/18/2019   in situ-Left shoulder-(CX35FU)   Social History   Socioeconomic History   Marital status: Married    Spouse name: Not on file   Number of children: 2   Years of education: Not on file   Highest education level: Not on file  Occupational History   Occupation: retired; [employer]  Tobacco Use   Smoking status: Former Smoker    Packs/day: 3.00    Years: 30.00    Pack years: 90.00    Types: Cigarettes    Quit date: 08/16/1996    Years since quitting: 23.9   Smokeless tobacco: Former Systems developer    Types: Chew    Quit date: 11/01/1994  Vaping Use   Vaping Use: Never used  Substance and Sexual Activity   Alcohol use: Yes    Alcohol/week: 1.0 standard drink    Types: 1 Cans of beer per week    Comment: Rarely.   Drug use: No   Sexual  activity: Not on file  Other Topics Concern   Not on file  Social History Narrative   Lives w/ wife   Social Determinants of Health   Financial Resource Strain:    Difficulty of Paying Living Expenses: Not on file  Food Insecurity:    Worried About [place] in the Last Year: Not on file   Ran Out of Food in the Last Year: Not on file  Transportation Needs:    Lack of Transportation (Medical): Not on file   Lack of Transportation (Non-Medical): Not on file  Physical Activity:    Days of  Exercise per Week: Not on file   Minutes of Exercise per Session: Not on file  Stress:    Feeling of Stress : Not on file  Social Connections:    Frequency of Communication with Friends and Family: Not on file   Frequency of Social Gatherings with Friends and Family: Not on file   Attends Religious Services: Not on file   Active Member of Clubs or Organizations: Not on file   Attends Archivist Meetings: Not on file   Marital Status: Not on file   Family History  Problem Relation Age of Onset   Prostate cancer Father    Diabetes Father    Hypertension Father    Stroke Father    Lung cancer Mother    Scheduled Meds:  amLODipine  5 mg Oral Daily   atorvastatin  20 mg Oral Daily   enoxaparin (LOVENOX) injection  40 mg Subcutaneous Q24H   hydrochlorothiazide  25 mg Oral Daily   losartan  100 mg Oral Daily   mirtazapine  45 mg Oral QHS   pantoprazole  40 mg Oral BID   terazosin  2 mg Oral QHS   Continuous Infusions: PRN Meds:.acetaminophen **OR** acetaminophen, ALPRAZolam, HYDROcodone-acetaminophen, morphine injection, ondansetron **OR** ondansetron (ZOFRAN) IV, phenol, polyethylene glycol Medications Prior to Admission:  Prior to Admission medications   Medication Sig Start Date End Date Taking? Authorizing Provider  acetaminophen (TYLENOL) 650 MG CR tablet Take 650 mg by mouth every 8 (eight) hours as needed for pain (arthritis).    Yes  [provider]  alprazolam Duanne Moron) 2 MG tablet Take 1 mg by mouth every 3 (three) hours.   Yes [provider]  amLODipine (NORVASC) 5 MG tablet Take 5 mg by mouth daily.    Yes [provider]  aspirin 81 MG tablet Take 81 mg by mouth daily.   Yes [provider]  atorvastatin (LIPITOR) 20 MG tablet Take 20 mg by mouth daily. 10/12/17  Yes [provider]  cholecalciferol (VITAMIN D3) 25 MCG (1000 UNIT) tablet Take 1,000 Units by mouth daily.   Yes [provider]  Coenzyme Q10 (CO Q 10) 100 MG CAPS Take 100 mg by mouth daily.   Yes [provider]  Fluorouracil (TOLAK) 4 % CREA Apply 1 application topically daily.    Yes [provider]  gabapentin (NEURONTIN) 100 MG capsule Take 100 mg by mouth daily as needed (Nerve pain).  10/25/19  Yes [provider]  gabapentin (NEURONTIN) 600 MG tablet Take 600 mg by mouth 3 (three) times daily.  11/08/19  Yes [provider]  hydrochlorothiazide (HYDRODIURIL) 25 MG tablet Take 25 mg by mouth daily.  08/11/12  Yes [provider]  losartan (COZAAR) 100 MG tablet Take 100 mg by mouth daily.   Yes [provider]  mirtazapine (REMERON) 45 MG tablet Take 45 mg by mouth at bedtime.   Yes [provider]  Multiple Vitamin (MULTIVITAMIN) tablet Take 1 tablet by mouth daily. Centrum Silver   Yes [provider]  pantoprazole (PROTONIX) 40 MG tablet Take 40 mg by mouth 2 (two) times daily.   Yes [provider]  Probiotic Product (ALIGN) 4 MG CAPS Take 4 mg by mouth daily.   Yes [provider]  tamsulosin (FLOMAX) 0.4 MG CAPS capsule Take 0.4 mg by mouth daily.  04/11/13  Yes [provider]  terazosin (HYTRIN) 2 MG capsule Take 2 mg by mouth at bedtime.    Yes  [provider]  Turmeric 500 MG CAPS Take 500 mg by mouth daily.    Yes [provider]  Wheat Dextrin (BENEFIBER PO) Take 1 Scoop by  mouth daily.    Yes [provider]  zinc gluconate 50 MG tablet Take 50 mg by mouth daily.   Yes [provider]  polyethylene glycol-electrolytes (TRILYTE) 420 g solution Take 4,000 mLs by mouth as directed. Patient not taking: Reported on 07/12/2020 06/24/20   Rourk, Cristopher Estimable, MD   Allergies  Allergen Reactions   Penicillins Rash    Has patient had a PCN reaction causing immediate rash, facial/tongue/throat swelling, SOB or lightheadedness with hypotension: yes Has patient had a PCN reaction causing severe rash involving mucus membranes or skin necrosis: no Has patient had a PCN reaction that required hospitalization: no Has patient had a PCN reaction occurring within the last 10 years:noIf all of the above answers are "NO", then may proceed with Cephalosporin use.    Review of Systems + weakness  Physical Exam Thin appearing gentleman resting in bed Appears somewhat pale Has dry mouth, tongue inflammation S1-S2 Regular work of breathing Some muscle wasting Awake alert nonfocal Abdomen with mild generalized tenderness.   Vital Signs: BP 117/64 (BP Location: Right Arm)    Pulse 87    Temp 98.2 F (36.8 C) (Oral)    Resp 17    Ht 5\' 7"  (1.702 m)    SpO2 95%    BMI 22.52 kg/m  Pain Scale: 0-10   Pain Score: 4    SpO2: SpO2: 95 % O2 Device:SpO2: 95 % O2 Flow Rate: .   IO: Intake/output summary:   Intake/Output Summary (Last 24 hours) at 07/13/2020 1314 Last data filed at 07/13/2020 2956 Gross per 24 hour  Intake 1056.1 ml  Output 800 ml  Net 256.1 ml    LBM: Last BM Date: 07/12/20 Baseline Weight:   Most recent weight:       Palliative Assessment/Data:   Palliative performance scale 50%  Time In: 10 Time Out: 11 Time Total: 60 Greater than 50%  of this time was spent counseling and coordinating care related to the above assessment and plan.  Signed by: Loistine Chance, MD   Please contact Palliative Medicine Team phone at [phone] for  questions and concerns.  For individual provider: See Shea Evans

## 2020-07-13 NOTE — H&P (View-Only) (Signed)
 Referring Provider: Dr. Ghimire, Kuber  Primary Care Physician:  Fusco, Lawrence, MD Primary Gastroenterologist:  Dr. Rourk   Reason for Consultation: Abdominal pain, IDA, weight loss  HPI: Lee Christensen is a 69 y.o. 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 pylori and GERD.  He presented to [facility] ED 07/12/2020 due to worsening abdominal pain.  In the ED, he was tachycardic and afebrile. Labs showed WBC 12.4. Hg 10.7. HCT 33.0. Normal LFTs.  An abdominal/pelvic CT scan showed multiple round ill-defined low-density lesions scattered throughout the liver highly suspicious for metastatic disease.  No definitive evidence of primary malignancy within the abdomen or pelvis was identified.  Colonic diverticulosis without evidence of acute diverticulitis and a small hiatal hernia were noted.  A chest CT showed a right upper lobe lung mass which extends to involve the right hilum consistent with primary lung malignancy and a 5 mm noncalcified left lower lobe and left basilar noncalcified lung nodule.  Oncologist Dr. Ennever was consulted and an EGD was recommended. A GI consult was requested for further evaluation regarding abdominal pain with IDA in setting of new diagnosis of lung cancer with metastasis.  He complains of having a decreased appetite for the past 3 to 5 months.  He was seen by his PCP due to having generalized mid to lower abdominal pain and reflux symptoms 3 to 4 months ago.  He reported a blood test was positive for H. pylori and he was prescribed 2 antibiotics which he took for 10 out of the prescribed 14 days.  His abdominal pain somewhat diminished but his poor appetite persisted.  His generalized mid to lower abdominal pain has recently worsened over the past few weeks.  He has frequent heartburn most days despite taking Pantoprazole twice daily.  No dysphagia.   He reports losing 25 to 30 pounds over the past 3 to 4 months with the last 10 pound of weight loss over the past few weeks.  No fever, sweats or chills.  No chest pain.  No shortness of breath but he has a chronic cough.  Sputum is usually yellowish but yesterday he coughed up brown-colored sputum.  No frank hemoptysis.  Prior smoker.  No alcohol use.  Passing normal formed brown bowel movement daily.  No rectal bleeding or melena.  Takes ASA 81 mg daily.  No other NSAIDs.  He underwent a colonoscopy by Dr. Rourk in 2014 which showed diverticulosis, no polyps.  We was scheduled to have an EGD and colonoscopy by Dr. Rourk on Wednesday  07/16/2020. No known family history of esophageal, gastric or colon cancer.  Mother had lung cancer.  His wife is present.  Laboratory studies 07/12/2020: BUN 12.  Creatinine 1.09.  Alk phos 81.  Albumin 2.9.  Lipase 22.  AST 25.  ALT 13.  Total bili 0.8.  WBC 12.4.  Hemoglobin 10.7 (bsseline Hg 13.5 on 01/02/2018).  Hematocrit 33.0.  MCV 88.9.  Platelets 607.  Influenza a and B negative.  SARS coronavirus 2 negative.  Laboratory studies 07/13/2020: Sodium 139.  Potassium 3.7.  Glucose 99.  BUN 9.  Creatinine 0.94.  Calcium 9.5.  Iron 20.  TIBC 236.  Ferritin 132.  WBC 9.0.  Hemoglobin 10.1.  Hematocrit 31.9.  MCV 89.1.  Platelet 535.  Abdominal/pelvic CT without contrast 07/13/2020: 1. Interval development of multiple rounded ill-defined low-density lesions scattered throughout the   liver highly suspicious for metastatic disease. No definite evidence of a primary malignancy within the abdomen or pelvis. 2. Colonic diverticulosis without evidence of acute diverticulitis. 3. Small hiatal hernia. 4. Aortic atherosclerosis  Chest CT 07/12/2020: 1. Posterior right upper lobe lung mass which extends to involve the right hilum, consistent with primary lung malignancy. 2. Numerous heterogeneous low-attenuation liver lesions, consistent with metastatic disease. 3. 5 mm  noncalcified left lower lobe and left basilar noncalcified lung nodule versus focal scar.  Colonoscopy 11/09/2012 by Dr. Gala Romney: Left-sided diverticulosis Call colonoscopy 10 years  Past Medical History:  Diagnosis Date  . Anemia   . Arthritis   . Bilateral inguinal hernia (BIH) s/p lap repair 12/29/2012 12/04/2012  . Chronic back pain   . Complication of anesthesia    slow to awaken after hernia surgery  . Diabetes mellitus without complication (HCC)    Borderline.  . Diverticulosis   . GERD (gastroesophageal reflux disease)   . Hemangioma of left chest wall s/p excision 12/29/2012 01/17/2013  . Hemangioma of left chest wall s/p excision 12/29/2012 01/17/2013  . Hypertension   . Knee pain   . Squamous cell carcinoma in situ (SCCIS) 09/18/2019   Left Shoulder (curet and 5FU)  . Squamous cell carcinoma in situ (SCCIS) 10/25/2019   Left Post Shoulder (treatment after biopsy)  . Squamous cell carcinoma in situ (SCCIS) 10/25/2019   Medial Scalp  . Squamous cell carcinoma in situ (SCCIS) 10/25/2019   Front Mid Scalp  . Squamous cell carcinoma of skin 10/25/2019   in situ-left post shoulder (txpbx), in situ-medial scalp- (CX35FU), in situ-front mid scalp- (CX35FU)  . Squamous cell carcinoma of skin 09/18/2019   in situ-Left shoulder-(CX35FU)    Past Surgical History:  Procedure Laterality Date  . CATARACT EXTRACTION W/ INTRAOCULAR LENS  IMPLANT, BILATERAL  1996  . COLONOSCOPY  04/03/01   WEX:HBZJIRCV hemorrhoids otherwise normal  . COLONOSCOPY N/A 11/09/2012   Dr. Gala Romney- normal rectum, scattered left-sided diverticula, the remainder of colonic mucosa appeared normal.  . ESOPHAGOGASTRODUODENOSCOPY  06/25/03   ELF:YBOFBP colored tongue-NO BARRETTs (EROSIVE ESOPHAGITIS)/small HH  . HERNIA REPAIR     ? lower abd  . INSERTION OF MESH Bilateral 12/29/2012   Procedure: INSERTION OF MESH;  Surgeon: Adin Hector, MD;  Location: [facility];  Service: General;  Laterality: Bilateral;  . KNEE  SURGERY     left x 2  . LAPAROSCOPIC INGUINAL HERNIA WITH UMBILICAL HERNIA Bilateral 12/29/2012   Procedure: LAPAROSCOPIC INGUINAL HERNIA WITH UMBILICAL HERNIA;  Surgeon: Adin Hector, MD;  Location: [facility];  Service: General;  Laterality: Bilateral;  . MASS EXCISION Left 12/29/2012   Procedure: EXCISION MASS LEFT CHEST WALL;  Surgeon: Adin Hector, MD;  Location: [facility];  Service: General;  Laterality: Left;  . WISDOM TOOTH EXTRACTION      Prior to Admission medications   Medication Sig Start Date End Date Taking? Authorizing Provider  acetaminophen (TYLENOL) 650 MG CR tablet Take 650 mg by mouth every 8 (eight) hours as needed for pain (arthritis).    Yes [provider]  alprazolam Duanne Moron) 2 MG tablet Take 1 mg by mouth every 3 (three) hours.   Yes [provider]  amLODipine (NORVASC) 5 MG tablet Take 5 mg by mouth daily.    Yes [provider]  aspirin 81 MG tablet Take 81 mg by mouth daily.   Yes [provider]  atorvastatin (LIPITOR) 20 MG tablet Take 20 mg by mouth daily. 10/12/17  Yes [provider]  cholecalciferol (VITAMIN D3) 25 MCG (1000 UNIT) tablet Take 1,000 Units by mouth daily.   Yes [provider]  Coenzyme Q10 (CO Q 10) 100 MG CAPS Take 100 mg by mouth daily.   Yes [provider]  Fluorouracil (TOLAK) 4 % CREA Apply 1 application topically daily.    Yes [provider]  gabapentin (NEURONTIN) 100 MG capsule Take 100 mg by mouth daily as needed (Nerve pain).  10/25/19  Yes [provider]  gabapentin (NEURONTIN) 600 MG tablet Take 600 mg by mouth 3 (three) times daily.  11/08/19  Yes [provider]  hydrochlorothiazide (HYDRODIURIL) 25 MG tablet Take 25 mg by mouth daily.  08/11/12  Yes [provider]  losartan (COZAAR) 100 MG tablet Take 100 mg by mouth daily.   Yes [provider]  mirtazapine (REMERON) 45 MG tablet Take 45 mg by mouth at bedtime.   Yes  [provider]  Multiple Vitamin (MULTIVITAMIN) tablet Take 1 tablet by mouth daily. Centrum Silver   Yes [provider]  pantoprazole (PROTONIX) 40 MG tablet Take 40 mg by mouth 2 (two) times daily.   Yes [provider]  Probiotic Product (ALIGN) 4 MG CAPS Take 4 mg by mouth daily.   Yes [provider]  tamsulosin (FLOMAX) 0.4 MG CAPS capsule Take 0.4 mg by mouth daily.  04/11/13  Yes [provider]  terazosin (HYTRIN) 2 MG capsule Take 2 mg by mouth at bedtime.    Yes [provider]  Turmeric 500 MG CAPS Take 500 mg by mouth daily.    Yes [provider]  Wheat Dextrin (BENEFIBER PO) Take 1 Scoop by mouth daily.    Yes [provider]  zinc gluconate 50 MG tablet Take 50 mg by mouth daily.   Yes [provider]  polyethylene glycol-electrolytes (TRILYTE) 420 g solution Take 4,000 mLs by mouth as directed. Patient not taking: Reported on 07/12/2020 06/24/20   Rourk, Cristopher Estimable, MD    Current Facility-Administered Medications  Medication Dose Route Frequency Provider Last Rate Last Admin  . acetaminophen (TYLENOL) tablet 650 mg  650 mg Oral Q6H PRN Harold Hedge, MD       Or  . acetaminophen (TYLENOL) suppository 650 mg  650 mg Rectal Q6H PRN Harold Hedge, MD      . ALPRAZolam Duanne Moron) tablet 1 mg  1 mg Oral QID PRN Barb Merino, MD      . amLODipine (NORVASC) tablet 5 mg  5 mg Oral Daily Harold Hedge, MD   5 mg at 07/13/20 1004  . atorvastatin (LIPITOR) tablet 20 mg  20 mg Oral Daily Harold Hedge, MD   20 mg at 07/13/20 1004  . enoxaparin (LOVENOX) injection 40 mg  40 mg Subcutaneous Q24H Harold Hedge, MD   40 mg at 07/12/20 1842  . hydrochlorothiazide (HYDRODIURIL) tablet 25 mg  25 mg Oral Daily Harold Hedge, MD   25 mg at 07/13/20 1003  . HYDROcodone-acetaminophen (NORCO/VICODIN) 5-325 MG per tablet 1-2 tablet  1-2 tablet Oral Q4H PRN Harold Hedge, MD   2 tablet at 07/13/20 0913  . losartan  (COZAAR) tablet 100 mg  100 mg Oral Daily Harold Hedge, MD   100 mg at 07/13/20 1003  . mirtazapine (REMERON) tablet 45 mg  45 mg Oral QHS Harold Hedge, MD   45 mg at 07/12/20 2136  . morphine 2 MG/ML injection 2  mg  2 mg Intravenous Q2H PRN Harold Hedge, MD   2 mg at 07/13/20 1004  . ondansetron (ZOFRAN) tablet 4 mg  4 mg Oral Q6H PRN Harold Hedge, MD       Or  . ondansetron [facility]) injection 4 mg  4 mg Intravenous Q6H PRN Harold Hedge, MD      . pantoprazole (PROTONIX) EC tablet 40 mg  40 mg Oral BID Harold Hedge, MD   40 mg at 07/13/20 1003  . phenol (CHLORASEPTIC) mouth spray 1 spray  1 spray Mouth/Throat PRN Harold Hedge, MD      . polyethylene glycol (MIRALAX / GLYCOLAX) packet 17 g  17 g Oral Daily PRN Harold Hedge, MD      . terazosin (HYTRIN) capsule 2 mg  2 mg Oral QHS Harold Hedge, MD   2 mg at 07/12/20 2216    Allergies as of 07/12/2020 - Review Complete 07/12/2020  Allergen Reaction Noted  . Penicillins Rash 10/31/2012    Family History  Problem Relation Age of Onset  . Prostate cancer Father   . Diabetes Father   . Hypertension Father   . Stroke Father   . Lung cancer Mother     Social History   Socioeconomic History  . Marital status: Married    Spouse name: Not on file  . Number of children: 2  . Years of education: Not on file  . Highest education level: Not on file  Occupational History  . Occupation: retired; [employer]  . Smoking status: Former Smoker    Packs/day: 3.00    Years: 30.00    Pack years: 90.00    Types: Cigarettes    Quit date: 08/16/1996    Years since quitting: 23.9  . Smokeless tobacco: Former Systems developer    Types: Chew    Quit date: 11/01/1994  Vaping Use  . Vaping Use: Never used  Substance and Sexual Activity  . Alcohol use: Yes    Alcohol/week: 1.0 standard drink    Types: 1 Cans of beer per week    Comment: Rarely.  . Drug use: No  . Sexual activity: Not on file  Other Topics Concern  . Not  on file  Social History Narrative   Lives w/ wife   Social Determinants of Health   Financial Resource Strain:   . Difficulty of Paying Living Expenses: Not on file  Food Insecurity:   . Worried About Charity fundraiser in the Last Year: Not on file  . Ran Out of Food in the Last Year: Not on file  Transportation Needs:   . Lack of Transportation (Medical): Not on file  . Lack of Transportation (Non-Medical): Not on file  Physical Activity:   . Days of Exercise per Week: Not on file  . Minutes of Exercise per Session: Not on file  Stress:   . Feeling of Stress : Not on file  Social Connections:   . Frequency of Communication with Friends and Family: Not on file  . Frequency of Social Gatherings with Friends and Family: Not on file  . Attends Religious Services: Not on file  . Active Member of Clubs or Organizations: Not on file  . Attends Archivist Meetings: Not on file  . Marital Status: Not on file  Intimate Partner Violence:   . Fear of Current or Ex-Partner: Not on file  . Emotionally Abused: Not on file  . Physically  Abused: Not on file  . Sexually Abused: Not on file    Review of Systems: Gen: See HPI. CV: Denies chest pain, palpitations or edema. Resp: Productive cough, sputum was brownish  yesterday.  No frank hemoptysis. GI: See HPI. GU : Denies urinary burning, blood in urine, increased urinary frequency or incontinence. MS: Denies joint pain, muscles aches or weakness. Derm: Denies rash, itchiness, skin lesions or unhealing ulcers. Psych: Denies depression, anxiety or memory loss. Heme: Denies easy bruising, bleeding. Neuro:  Denies headaches, dizziness or paresthesias. Endo:  No current diabetes.  Physical Exam: Vital signs in last 24 hours: Temp:  [98.2 F (36.8 C)-98.7 F (37.1 C)] 98.2 F (36.8 C) (11/28 0543) Pulse Rate:  [71-115] 87 (11/28 0543) Resp:  [15-21] 17 (11/28 0543) BP: (94-150)/(64-84) 117/64 (11/28 0543) SpO2:  [94 %-99  %] 95 % (11/28 0543) Last BM Date: 07/12/20 General:  Alert 69 year old male in no acute distress. Head:  Normocephalic and atraumatic. Eyes:  No scleral icterus. Conjunctiva pink. Ears:  Normal auditory acuity. Nose:  No deformity, discharge or lesions. Mouth:  Dentition intact.  Brown coating on tongue, glossitis.  No ulcers or lesions.  Neck:  Supple. No lymphadenopathy or thyromegaly.  Lungs: Breath sounds clear throughout. Heart: Regular rate and rhythm, no murmurs. Abdomen: Soft, nondistended.  Nontender.  Positive bowel sounds to all 4 quadrants. Rectal: Deferred. Musculoskeletal:  Symmetrical without gross deformities.  Pulses:  Normal pulses noted. Extremities:  Without clubbing or edema. Neurologic:  Alert and  oriented x4. No focal deficits.  Skin:  Intact without significant lesions or rashes. Psych:  Alert and cooperative. Normal mood and affect.  Intake/Output from previous day: 11/27 0701 - 11/28 0700 In: 936.1 [P.O.:370; IV Piggyback:566.1] Out: 800 [Urine:800] Intake/Output this shift: Total I/O In: 120 [P.O.:120] Out: -   Lab Results: Recent Labs    07/12/20 1339 07/13/20 0602  WBC 12.4* 9.0  HGB 10.7* 10.1*  HCT 33.0* 31.9*  PLT 607* 535*   BMET Recent Labs    07/12/20 1339 07/13/20 0602  NA 137 139  K 3.8 3.7  CL 102 105  CO2 28 24  GLUCOSE 176* 99  BUN 12 9  CREATININE 1.09 0.94  CALCIUM 9.2 9.5   LFT Recent Labs    07/12/20 1339  PROT 7.1  ALBUMIN 2.9*  AST 25  ALT 13  ALKPHOS 81  BILITOT 0.8   PT/INR No results for input(s): LABPROT, INR in the last 72 hours. Hepatitis Panel No results for input(s): HEPBSAG, HCVAB, HEPAIGM, HEPBIGM in the last 72 hours.    Studies/Results: CT ABDOMEN PELVIS WO CONTRAST  Result Date: 07/12/2020 CLINICAL DATA:  Abdominal pain EXAM: CT ABDOMEN AND PELVIS WITHOUT CONTRAST TECHNIQUE: Multidetector CT imaging of the abdomen and pelvis was performed following the standard protocol without IV  contrast. COMPARISON:  10/21/2017 FINDINGS: Lower chest: Prominent subpleural fat again noted overlying the lateral aspect of the right lower lobe. Lung bases are clear. Heart size is normal. Hepatobiliary: Interval development of multiple rounded ill-defined low-density lesions scattered throughout the liver highly suspicious for metastatic disease. Unremarkable appearance of the gallbladder. No hyperdense stone or biliary dilatation. Pancreas: Unremarkable. No pancreatic ductal dilatation or surrounding inflammatory changes. Spleen: Normal in size without focal abnormality. Adrenals/Urinary Tract: Unremarkable adrenal glands. Bilateral kidneys are within normal limits. No renal stone or hydronephrosis. Nonspecific bilateral perinephric stranding is similar to prior. Urinary bladder is within normal limits. Stomach/Bowel: Evaluation of the bowel is somewhat limited in the absence of  oral or intravenous contrast. Small hiatal hernia. Stomach otherwise appears within normal limits. There are no dilated loops of small bowel. Scattered colonic diverticulosis. No focal bowel wall thickening or inflammatory changes. A normal appendix is seen adjacent to the tip of the right hepatic lobe (series 4, image 62). Vascular/Lymphatic: Scattered aortoiliac atherosclerotic calcifications without aneurysm. No abdominopelvic lymphadenopathy. Reproductive: Prostate gland is within normal limits. Other: No free fluid. No abdominopelvic fluid collection. No pneumoperitoneum. No abdominal wall hernia. Musculoskeletal: No acute osseous abnormality. No suspicious bone lesion. IMPRESSION: 1. Interval development of multiple rounded ill-defined low-density lesions scattered throughout the liver highly suspicious for metastatic disease. No definite evidence of a primary malignancy within the abdomen or pelvis. 2. Colonic diverticulosis without evidence of acute diverticulitis. 3. Small hiatal hernia. 4. Aortic atherosclerosis.  (ICD10-I70.0). Electronically Signed   By: Davina Poke D.O.   On: 07/12/2020 14:33   CT CHEST W CONTRAST  Result Date: 07/12/2020 CLINICAL DATA:  Cancer of unknown primary. EXAM: CT CHEST WITH CONTRAST TECHNIQUE: Multidetector CT imaging of the chest was performed during intravenous contrast administration. CONTRAST:  40m OMNIPAQUE IOHEXOL 300 MG/ML  SOLN COMPARISON:  None. FINDINGS: Cardiovascular: No significant vascular findings. Normal heart size. No pericardial effusion. Mediastinum/Nodes: 2.5 cm x 1.5 cm and 2.1 cm x 1.4 cm pretracheal lymph nodes are seen. Thyroid gland, trachea, and esophagus demonstrate no significant findings. Lungs/Pleura: A 7.6 cm x 5.2 cm heterogeneous low-attenuation mass is seen within the posterior aspect of the right upper lobe. This extends to involve the right hilum. A 5 mm noncalcified lung nodule is seen within the posterior aspect of the left lung base (axial CT image 125, CT series number 5). A 5 mm ill-defined noncalcified lung nodule versus focal scar seen within the lateral aspect of the left lower lobe (axial CT image 107, CT series number 5). There is no evidence of a pleural effusion or pneumothorax. A small pleural based area of fat attenuation is seen along the anterolateral aspect of the mid right lung. Upper Abdomen: Numerous heterogeneous low-attenuation liver lesions are seen scattered throughout the right and left lobes of the liver. The largest measures approximately 2.0 cm x 1.7 cm and is seen near the caudate lobe. There is a small hiatal hernia. Musculoskeletal: No chest wall abnormality. No acute or significant osseous findings. IMPRESSION: 1. Posterior right upper lobe lung mass which extends to involve the right hilum, consistent with primary lung malignancy. 2. Numerous heterogeneous low-attenuation liver lesions, consistent with metastatic disease. 3. 5 mm noncalcified left lower lobe and left basilar noncalcified lung nodule versus focal scar.  Electronically Signed   By: TVirgina NorfolkM.D.   On: 07/12/2020 17:43   DG Chest Portable 1 View  Result Date: 07/12/2020 CLINICAL DATA:  Cough EXAM: PORTABLE CHEST 1 VIEW COMPARISON:  Chest x-ray 01/02/2018 FINDINGS: The heart size and mediastinal contours are within normal limits. Interval development of a right upper lobe airspace opacity. Suggestion of thickened right paratracheal stripe. No pulmonary edema. No pleural effusion. No pneumothorax. No acute osseous abnormality. IMPRESSION: Interval development of right upper lobe airspace opacity and suggestion of thickened right paratracheal stripe. Finding could represent infection/inflammation with underlying malignancy not excluded. Recommend CT chest with intravenous contrast for further evaluation. Electronically Signed   By: MIven FinnM.D.   On: 07/12/2020 15:29    IMPRESSION/PLAN:  175  69year old male with primary lung cancer with hepatic metastasis in the setting of worsening generalized mid to lower abdominal pain and weight  loss.  Chest CT 11/27 identified a 7.6 x 5.2 right lung mass consistent with primary malignancy. CTAP 11/27 showed evidence of hepatic metastasis.  EGD requested by Dr. Marin Olp prior to initiating cancer treatment.  He is potentially scheduled for a bronchoscopy and lung biopsy tomorrow. -EGD +/-colonoscopy (last colonoscopy was in 2014). EGD and colonoscopy benefits and risks discussed including risk with sedation, risk of bleeding, perforation and infection.  Timing of the EGD +/-  colonoscopy to be determined after bronchoscopy procedure time and date verified. Further recommendations per Dr. Bryan Lemma.  -Clear liquids today -BMP and CBC in a.m.  2.  IDA -See plan in #1  3.  GERD. Recently  treated for + H. Pylori ab. -Continue Pantoprazole 40 mg p.o. twice daily  4. Past smoker   Noralyn Pick  07/13/2020, 12:22 PM

## 2020-07-13 NOTE — Consult Note (Signed)
Referral MD  Reason for Referral: Abdominal pain; right lung mass with liver nodules.  Chief Complaint  Patient presents with  . Abdominal Pain  : I can eat because of reflux.  HPI: Mr. Barradas is a very nice 69 year old white male.  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  He smoked quite a bit.  Has about a 90-pack-year history of tobacco use.  He is lost weight.  He has not really been able to eat because of reflux.  He sees Dr. Gala Romney up in [place].  He is scheduled for a upper endoscopy and colonoscopy on Wednesday.  Because of the weight loss, abdominal pain, he came to the emergency room on Saturday.  His wife, who is with him, his very worried about him.  She mostly worried about the weight loss.  He is really had no cough.  He has had no chest pain.  He has had some back discomfort.  He had a CT scan.  Surprisingly, this showed a large mass in the right lung.  It was a 7.6 x 5.2 cm mass in the right upper lobe.  This extends into the right hilum.  He had some pretracheal lymph nodes that were enlarged.  He had a 5 mm nodule in the left lower lobe.  He had numerous liver lesions.  His labs on admission showed a white cell count 12.4.  Hemoglobin 10.7.  Platelet count 607,000.  His LDH is 154.  His low blood sugar is 176.  His calcium is 9.2 with an albumin of 2.9.  His bilirubin is 0.8.  He has had no rashes.  He has had no obvious swollen lymph nodes.  There is been no dysuria.  He has had no leg swelling.  He has had no headache.  Currently, his performance status is ECOG 1.    Past Medical History:  Diagnosis Date  . Anemia   . Arthritis   . Bilateral inguinal hernia (BIH) s/p lap repair 12/29/2012 12/04/2012  . Chronic back pain   . Complication of anesthesia    slow to awaken after hernia surgery  . Diabetes mellitus without complication (HCC)    Borderline.  . Diverticulosis   . GERD (gastroesophageal reflux disease)   . Hemangioma of left chest wall s/p excision  12/29/2012 01/17/2013  . Hemangioma of left chest wall s/p excision 12/29/2012 01/17/2013  . Hypertension   . Knee pain   . Squamous cell carcinoma in situ (SCCIS) 09/18/2019   Left Shoulder (curet and 5FU)  . Squamous cell carcinoma in situ (SCCIS) 10/25/2019   Left Post Shoulder (treatment after biopsy)  . Squamous cell carcinoma in situ (SCCIS) 10/25/2019   Medial Scalp  . Squamous cell carcinoma in situ (SCCIS) 10/25/2019   Front Mid Scalp  . Squamous cell carcinoma of skin 10/25/2019   in situ-left post shoulder (txpbx), in situ-medial scalp- (CX35FU), in situ-front mid scalp- (CX35FU)  . Squamous cell carcinoma of skin 09/18/2019   in situ-Left shoulder-(CX35FU)  :  Past Surgical History:  Procedure Laterality Date  . CATARACT EXTRACTION W/ INTRAOCULAR LENS  IMPLANT, BILATERAL  1996  . COLONOSCOPY  04/03/01   IHK:[id number] hemorrhoids otherwise normal  . COLONOSCOPY N/A 11/09/2012   Dr. Gala Romney- normal rectum, scattered left-sided diverticula, the remainder of colonic mucosa appeared normal.  . ESOPHAGOGASTRODUODENOSCOPY  06/25/03   OVF:IEPPIR colored tongue-NO BARRETTs (EROSIVE ESOPHAGITIS)/small HH  . HERNIA REPAIR     ? lower abd  . INSERTION OF MESH Bilateral  12/29/2012   Procedure: INSERTION OF MESH;  Surgeon: Adin Hector, MD;  Location: [facility];  Service: General;  Laterality: Bilateral;  . KNEE SURGERY     left x 2  . LAPAROSCOPIC INGUINAL HERNIA WITH UMBILICAL HERNIA Bilateral 12/29/2012   Procedure: LAPAROSCOPIC INGUINAL HERNIA WITH UMBILICAL HERNIA;  Surgeon: Adin Hector, MD;  Location: [facility];  Service: General;  Laterality: Bilateral;  . MASS EXCISION Left 12/29/2012   Procedure: EXCISION MASS LEFT CHEST WALL;  Surgeon: Adin Hector, MD;  Location: [facility];  Service: General;  Laterality: Left;  . WISDOM TOOTH EXTRACTION    :   Current Facility-Administered Medications:  .  acetaminophen (TYLENOL) tablet 650 mg, 650 mg, Oral, Q6H PRN **OR** acetaminophen  (TYLENOL) suppository 650 mg, 650 mg, Rectal, Q6H PRN, Harold Hedge, MD .  ALPRAZolam Duanne Moron) tablet 1 mg, 1 mg, Oral, BID PRN, Harold Hedge, MD, 1 mg at 07/12/20 2140 .  amLODipine (NORVASC) tablet 5 mg, 5 mg, Oral, Daily, Harold Hedge, MD .  aspirin EC tablet 81 mg, 81 mg, Oral, Daily, Marva Panda E, MD .  atorvastatin (LIPITOR) tablet 20 mg, 20 mg, Oral, Daily, Marva Panda E, MD .  enoxaparin (LOVENOX) injection 40 mg, 40 mg, Subcutaneous, Q24H, Harold Hedge, MD, 40 mg at 07/12/20 1842 .  hydrochlorothiazide (HYDRODIURIL) tablet 25 mg, 25 mg, Oral, Daily, Harold Hedge, MD .  HYDROcodone-acetaminophen (NORCO/VICODIN) 5-325 MG per tablet 1-2 tablet, 1-2 tablet, Oral, Q4H PRN, Harold Hedge, MD, 2 tablet at 07/13/20 0204 .  losartan (COZAAR) tablet 100 mg, 100 mg, Oral, Daily, Marva Panda E, MD .  mirtazapine (REMERON) tablet 45 mg, 45 mg, Oral, QHS, Harold Hedge, MD, 45 mg at 07/12/20 2136 .  morphine 2 MG/ML injection 2 mg, 2 mg, Intravenous, Q2H PRN, Harold Hedge, MD, 2 mg at 07/13/20 0653 .  ondansetron (ZOFRAN) tablet 4 mg, 4 mg, Oral, Q6H PRN **OR** ondansetron (ZOFRAN) injection 4 mg, 4 mg, Intravenous, Q6H PRN, Harold Hedge, MD .  pantoprazole (PROTONIX) EC tablet 40 mg, 40 mg, Oral, BID, Harold Hedge, MD, 40 mg at 07/12/20 2136 .  phenol (CHLORASEPTIC) mouth spray 1 spray, 1 spray, Mouth/Throat, PRN, Harold Hedge, MD .  polyethylene glycol (MIRALAX / GLYCOLAX) packet 17 g, 17 g, Oral, Daily PRN, Harold Hedge, MD .  terazosin (HYTRIN) capsule 2 mg, 2 mg, Oral, QHS, Harold Hedge, MD, 2 mg at 07/12/20 2216:  . amLODipine  5 mg Oral Daily  . aspirin EC  81 mg Oral Daily  . atorvastatin  20 mg Oral Daily  . enoxaparin (LOVENOX) injection  40 mg Subcutaneous Q24H  . hydrochlorothiazide  25 mg Oral Daily  . losartan  100 mg Oral Daily  . mirtazapine  45 mg Oral QHS  . pantoprazole  40 mg Oral BID  . terazosin  2 mg Oral QHS  :  Allergies  Allergen Reactions   . Penicillins Rash    Has patient had a PCN reaction causing immediate rash, facial/tongue/throat swelling, SOB or lightheadedness with hypotension: yes Has patient had a PCN reaction causing severe rash involving mucus membranes or skin necrosis: no Has patient had a PCN reaction that required hospitalization: no Has patient had a PCN reaction occurring within the last 10 years:noIf all of the above answers are "NO", then may proceed with Cephalosporin use.   :  Family History  Problem Relation Age of Onset  . Prostate  cancer Father   . Diabetes Father   . Hypertension Father   . Stroke Father   . Lung cancer Mother   :  Social History   Socioeconomic History  . Marital status: Married    Spouse name: Not on file  . Number of children: 2  . Years of education: Not on file  . Highest education level: Not on file  Occupational History  . Occupation: retired; [employer]  . Smoking status: Former Smoker    Packs/day: 3.00    Years: 30.00    Pack years: 90.00    Types: Cigarettes    Quit date: 08/16/1996    Years since quitting: 23.9  . Smokeless tobacco: Former Systems developer    Types: Chew    Quit date: 11/01/1994  Vaping Use  . Vaping Use: Never used  Substance and Sexual Activity  . Alcohol use: Yes    Alcohol/week: 1.0 standard drink    Types: 1 Cans of beer per week    Comment: Rarely.  . Drug use: No  . Sexual activity: Not on file  Other Topics Concern  . Not on file  Social History Narrative   Lives w/ wife   Social Determinants of Health   Financial Resource Strain:   . Difficulty of Paying Living Expenses: Not on file  Food Insecurity:   . Worried About Charity fundraiser in the Last Year: Not on file  . Ran Out of Food in the Last Year: Not on file  Transportation Needs:   . Lack of Transportation (Medical): Not on file  . Lack of Transportation (Non-Medical): Not on file  Physical Activity:   . Days of Exercise per Week: Not on file   . Minutes of Exercise per Session: Not on file  Stress:   . Feeling of Stress : Not on file  Social Connections:   . Frequency of Communication with Friends and Family: Not on file  . Frequency of Social Gatherings with Friends and Family: Not on file  . Attends Religious Services: Not on file  . Active Member of Clubs or Organizations: Not on file  . Attends Archivist Meetings: Not on file  . Marital Status: Not on file  Intimate Partner Violence:   . Fear of Current or Ex-Partner: Not on file  . Emotionally Abused: Not on file  . Physically Abused: Not on file  . Sexually Abused: Not on file  :  Review of Systems  Constitutional: Positive for weight loss.  HENT: Negative.   Eyes: Negative.   Respiratory: Negative.   Cardiovascular: Negative.   Gastrointestinal: Positive for abdominal pain and nausea.  Genitourinary: Negative.   Musculoskeletal: Positive for back pain.  Skin: Negative.   Neurological: Negative.   Endo/Heme/Allergies: Negative.   Psychiatric/Behavioral: The patient is nervous/anxious.      Exam:  This is a thin white male in no obvious distress.  Vital signs show temperature of 98.2.  Pulse 87.  Blood pressure 117/64.  Weight is not available.  Head neck exam shows no ocular or oral lesions.  He has no palpable cervical or supraclavicular lymph node.  There may be a little bit of inflammation of the tongue.  His lungs sound pretty clear bilaterally.  He has no wheezes.  Cardiac exam regular rate and rhythm with no murmurs, rubs or bruits.  Abdomen soft.  He has good bowel sounds.  There is some tenderness in the upper abdomen.  He has  no obvious hepatomegaly.  Extremities show some muscle atrophy in upper and lower extremities that is symmetric.  He has good range of motion of his joints.  Neurological exam shows no focal neurological deficits.  Skin exam shows no rashes, ecchymoses or petechia.  Patient Vitals for the past 24 hrs:  BP Temp Temp src  Pulse Resp SpO2 Height  07/13/20 0543 117/64 98.2 F (36.8 C) Oral 87 17 95 % --  07/13/20 0158 129/67 98.4 F (36.9 C) -- 81 15 97 % --  07/12/20 2203 105/70 98.2 F (36.8 C) -- 71 17 94 % --  07/12/20 1744 131/72 98.7 F (37.1 C) -- 82 16 98 % 5\' 7"  (1.702 m)  07/12/20 1630 94/68 -- -- 87 17 96 % --  07/12/20 1530 133/79 -- -- 93 17 97 % --  07/12/20 1500 131/84 -- -- 100 (!) 21 96 % --  07/12/20 1430 115/78 -- -- -- 18 99 % --  07/12/20 1400 129/75 -- -- (!) 103 16 97 % --  07/12/20 1330 118/77 -- -- (!) 113 17 95 % --  07/12/20 1249 (!) 150/80 98.6 F (37 C) Oral (!) 115 20 97 % --     Recent Labs    07/12/20 1339 07/13/20 0602  WBC 12.4* 9.0  HGB 10.7* 10.1*  HCT 33.0* 31.9*  PLT 607* 535*   Recent Labs    07/12/20 1339 07/13/20 0602  NA 137 139  K 3.8 3.7  CL 102 105  CO2 28 24  GLUCOSE 176* 99  BUN 12 9  CREATININE 1.09 0.94  CALCIUM 9.2 9.5    Blood smear review: None  Pathology: Pending    Assessment and Plan: Mr. Buckalew is a 69 year old male.  Had believe that this is going to be bronchogenic carcinoma that is metastasized to the liver already.  Of note, his mother passed away from a small cell lung cancer.  This was about 18 years ago.  I think he is going to need a bronchoscopy.  I would think this right upper lobe mass could be reached by bronchoscopy.  I think he is also going need an upper endoscopy.  His biggest complaint has been this reflux.  I am not sure what might be going on.  I would not think that he would have any tumor in the stomach.  However, this is always a possibility.  He is anemic with thrombocytosis.  I suspect he probably is iron deficient.  This might explain some of the glossitis that he has.  I told Mr. Gunderman and his wife that this likely is going to be a form of lung cancer.  I told him that this is some that is not good to be curable or could be taken care of by surgery because of the fact that it is already spread to his  liver.  We really need to get a biopsy to know what is going on.  Once we get the biopsy, we will have to get molecular markers.  Again, he does need to have an upper endoscopy while he is here so we can look at his stomach.  If we are going to treat him for his malignancy, we really have to make sure that there is nothing in the stomach that could cause problems.  We will follow along.  Additional studies probably will include a MRI of the brain and a bone scan.  I know that the staff on [address] will do  a fantastic job with him.  Lattie Haw, MD  Oswaldo Milian 41:10

## 2020-07-14 ENCOUNTER — Other Ambulatory Visit (HOSPITAL_COMMUNITY): Payer: Medicare HMO

## 2020-07-14 ENCOUNTER — Inpatient Hospital Stay (HOSPITAL_COMMUNITY): Payer: Medicare HMO | Admitting: Certified Registered"

## 2020-07-14 ENCOUNTER — Inpatient Hospital Stay (HOSPITAL_COMMUNITY): Payer: Medicare HMO

## 2020-07-14 ENCOUNTER — Encounter (HOSPITAL_COMMUNITY): Payer: Self-pay | Admitting: Internal Medicine

## 2020-07-14 ENCOUNTER — Other Ambulatory Visit (HOSPITAL_COMMUNITY)
Admission: RE | Admit: 2020-07-14 | Discharge: 2020-07-14 | Disposition: A | Discharge status: Home / Self Care | Payer: Medicare HMO | Source: Ambulatory Visit | Attending: Internal Medicine | Admitting: Internal Medicine

## 2020-07-14 ENCOUNTER — Encounter (HOSPITAL_COMMUNITY)
Admission: EM | Disposition: A | Discharge status: Home / Self Care | Payer: Self-pay | Source: Home / Self Care | Attending: Internal Medicine

## 2020-07-14 DIAGNOSIS — R52 Pain, unspecified: Secondary | ICD-10-CM | POA: Diagnosis not present

## 2020-07-14 DIAGNOSIS — R634 Abnormal weight loss: Secondary | ICD-10-CM

## 2020-07-14 DIAGNOSIS — K222 Esophageal obstruction: Secondary | ICD-10-CM

## 2020-07-14 DIAGNOSIS — D509 Iron deficiency anemia, unspecified: Secondary | ICD-10-CM

## 2020-07-14 DIAGNOSIS — C3411 Malignant neoplasm of upper lobe, right bronchus or lung: Secondary | ICD-10-CM | POA: Diagnosis not present

## 2020-07-14 DIAGNOSIS — C787 Secondary malignant neoplasm of liver and intrahepatic bile duct: Secondary | ICD-10-CM | POA: Diagnosis not present

## 2020-07-14 DIAGNOSIS — R1319 Other dysphagia: Secondary | ICD-10-CM

## 2020-07-14 DIAGNOSIS — R935 Abnormal findings on diagnostic imaging of other abdominal regions, including retroperitoneum: Secondary | ICD-10-CM

## 2020-07-14 DIAGNOSIS — B370 Candidal stomatitis: Secondary | ICD-10-CM

## 2020-07-14 DIAGNOSIS — R1310 Dysphagia, unspecified: Secondary | ICD-10-CM

## 2020-07-14 HISTORY — PX: COLONOSCOPY: SHX5424

## 2020-07-14 HISTORY — PX: BALLOON DILATION: SHX5330

## 2020-07-14 HISTORY — PX: ESOPHAGOGASTRODUODENOSCOPY: SHX5428

## 2020-07-14 HISTORY — PX: BIOPSY: SHX5522

## 2020-07-14 LAB — PROTIME-INR
INR: 1 (ref 0.8–1.2)
Prothrombin Time: 12.8 seconds (ref 11.4–15.2)

## 2020-07-14 LAB — BASIC METABOLIC PANEL
Anion gap: 12 (ref 5–15)
BUN: 10 mg/dL (ref 8–23)
CO2: 22 mmol/L (ref 22–32)
Calcium: 9.9 mg/dL (ref 8.9–10.3)
Chloride: 108 mmol/L (ref 98–111)
Creatinine, Ser: 0.99 mg/dL (ref 0.61–1.24)
GFR, Estimated: >60 mL/min (ref >60)
Glucose, Bld: 121 mg/dL — ABNORMAL HIGH (ref 70–99)
Potassium: 3.8 mmol/L (ref 3.5–5.1)
Sodium: 142 mmol/L (ref 135–145)

## 2020-07-14 LAB — CBC
HCT: 33.5 % — ABNORMAL LOW (ref 39.0–52.0)
Hemoglobin: 10.7 g/dL — ABNORMAL LOW (ref 13.0–17.0)
MCH: 28.5 pg (ref 26.0–34.0)
MCHC: 31.9 g/dL (ref 30.0–36.0)
MCV: 89.1 fL (ref 80.0–100.0)
Platelets: 570 10*3/uL — ABNORMAL HIGH (ref 150–400)
RBC: 3.76 MIL/uL — ABNORMAL LOW (ref 4.22–5.81)
RDW: 13.3 % (ref 11.5–15.5)
WBC: 9.2 10*3/uL (ref 4.0–10.5)
nRBC: 0 % (ref 0.0–0.2)

## 2020-07-14 LAB — CEA: CEA: 31 ng/mL — ABNORMAL HIGH (ref 0.0–4.7)

## 2020-07-14 SURGERY — EGD (ESOPHAGOGASTRODUODENOSCOPY)
Anesthesia: Monitor Anesthesia Care

## 2020-07-14 MED ORDER — IOHEXOL 300 MG/ML  SOLN
100.0000 mL | Freq: Once | INTRAMUSCULAR | Status: AC | PRN
  Administered 2020-07-14: 100 mL via INTRAVENOUS

## 2020-07-14 MED ORDER — PROPOFOL 500 MG/50ML IV EMUL
INTRAVENOUS | Status: DC | PRN
  Administered 2020-07-14: 135 ug/kg/min via INTRAVENOUS

## 2020-07-14 MED ORDER — IOHEXOL 9 MG/ML PO SOLN
ORAL | Status: AC
  Filled 2020-07-14: qty 1000

## 2020-07-14 MED ORDER — PANTOPRAZOLE SODIUM 40 MG PO TBEC
40.0000 mg | DELAYED_RELEASE_TABLET | Freq: Two times a day (BID) | ORAL | Status: DC
  Administered 2020-07-14 – 2020-07-16 (×4): 40 mg via ORAL
  Filled 2020-07-14 (×4): qty 1

## 2020-07-14 MED ORDER — PROPOFOL 10 MG/ML IV BOLUS
INTRAVENOUS | Status: DC | PRN
  Administered 2020-07-14 (×3): 10 mg via INTRAVENOUS

## 2020-07-14 MED ORDER — IOHEXOL 9 MG/ML PO SOLN
500.0000 mL | ORAL | Status: AC
  Administered 2020-07-14 (×2): 500 mL via ORAL

## 2020-07-14 MED ORDER — PROPOFOL 1000 MG/100ML IV EMUL
INTRAVENOUS | Status: AC
  Filled 2020-07-14: qty 100

## 2020-07-14 MED ORDER — TAMSULOSIN HCL 0.4 MG PO CAPS
0.4000 mg | ORAL_CAPSULE | Freq: Every day | ORAL | Status: DC
  Administered 2020-07-14 – 2020-07-16 (×3): 0.4 mg via ORAL
  Filled 2020-07-14 (×3): qty 1

## 2020-07-14 MED ORDER — NYSTATIN 100000 UNIT/ML MT SUSP
5.0000 mL | Freq: Four times a day (QID) | OROMUCOSAL | Status: DC
  Administered 2020-07-14 – 2020-07-16 (×5): 500000 [IU] via ORAL
  Filled 2020-07-14 (×5): qty 5

## 2020-07-14 MED ORDER — ENSURE ENLIVE PO LIQD: 237.0000 mL | Freq: Two times a day (BID) | ORAL | Status: DC

## 2020-07-14 NOTE — Consult Note (Signed)
Chief Complaint: Patient was seen in consultation today for image guided liver lesion biopsy Chief Complaint  Patient presents with   Abdominal Pain    Referring Physician(s): [place]  Supervising Physician: Corrie Mckusick  Patient Status: [facility]  History of Present Illness: Lee Christensen is a 69 y.o. 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  CT scan of abdomen pelvis revealed multiple lesions throughout the liver, colonic diverticulosis without diverticulitis, small hiatal hernia.  CT chest revealed posterior right upper lobe lung mass which extends to involve the right hilum, 5 mm noncalcified left lower lobe and left basilar noncalcified lung nodule versus focal scar.  EGD and colonoscopy negative for malignancy.  Request now received for image guided liver lesion biopsy for further evaluation.  Past Medical History:  Diagnosis Date   Anemia    Arthritis    Bilateral inguinal hernia (BIH) s/p lap repair 12/29/2012 12/04/2012   Chronic back pain    Complication of anesthesia    slow to awaken after hernia surgery   Diabetes mellitus without complication ([place])    Borderline.   Diverticulosis    GERD (gastroesophageal reflux disease)    Goals of care, counseling/discussion 07/13/2020   Hemangioma of left chest wall s/p excision 12/29/2012 01/17/2013   Hemangioma of left chest wall s/p excision 12/29/2012 01/17/2013   Hypertension    Knee pain    Metastatic cancer ([place]) 07/13/2020   Non-small cell lung cancer metastatic to liver ([place]) 07/13/2020   Squamous cell carcinoma in situ (SCCIS) 09/18/2019   Left Shoulder (curet and 5FU)   Squamous cell carcinoma in situ (SCCIS) 10/25/2019   Left Post Shoulder (treatment after biopsy)    Squamous cell carcinoma in situ (SCCIS) 10/25/2019   Medial Scalp   Squamous cell carcinoma in situ (SCCIS) 10/25/2019   Front Mid Scalp   Squamous cell carcinoma of skin 10/25/2019   in situ-left post shoulder (txpbx), in situ-medial scalp- (CX35FU), in situ-front mid scalp- (CX35FU)   Squamous cell carcinoma of skin 09/18/2019   in situ-Left shoulder-(CX35FU)    Past Surgical History:  Procedure Laterality Date   CATARACT EXTRACTION W/ INTRAOCULAR LENS  IMPLANT, BILATERAL  1996   COLONOSCOPY  04/03/01   [facility]:NFAOZHYQ hemorrhoids otherwise normal   COLONOSCOPY N/A 11/09/2012   Dr. Gala Romney- normal rectum, scattered left-sided diverticula, the remainder of colonic mucosa appeared normal.   ESOPHAGOGASTRODUODENOSCOPY  06/25/03   [facility]:QIONGE colored tongue-NO BARRETTs (EROSIVE ESOPHAGITIS)/small HH   HERNIA REPAIR     ? lower abd   INSERTION OF MESH Bilateral 12/29/2012   Procedure: INSERTION OF MESH;  Surgeon: Adin Hector, MD;  Location: [facility];  Service: General;  Laterality: Bilateral;   KNEE SURGERY     left x 2   LAPAROSCOPIC INGUINAL HERNIA WITH UMBILICAL HERNIA Bilateral 12/29/2012   Procedure: LAPAROSCOPIC INGUINAL HERNIA WITH UMBILICAL HERNIA;  Surgeon: Adin Hector, MD;  Location: [facility];  Service: General;  Laterality: Bilateral;   MASS EXCISION Left 12/29/2012   Procedure: EXCISION MASS LEFT CHEST WALL;  Surgeon: Adin Hector, MD;  Location: [facility];  Service: General;  Laterality: Left;   WISDOM TOOTH EXTRACTION      Allergies: Penicillins  Medications: Prior to Admission medications   Medication Sig Start Date End Date Taking?  Authorizing Provider  acetaminophen (TYLENOL) 650 MG CR tablet Take 650 mg by mouth every 8 (eight) hours as needed for pain (arthritis).    Yes [provider]  alprazolam Duanne Moron) 2 MG tablet Take 1 mg by mouth every 3 (three) hours.   Yes [provider]  amLODipine (NORVASC) 5 MG tablet Take 5 mg by mouth  daily.    Yes [provider]  aspirin 81 MG tablet Take 81 mg by mouth daily.   Yes [provider]  atorvastatin (LIPITOR) 20 MG tablet Take 20 mg by mouth daily. 10/12/17  Yes [provider]  cholecalciferol (VITAMIN D3) 25 MCG (1000 UNIT) tablet Take 1,000 Units by mouth daily.   Yes [provider]  Coenzyme Q10 (CO Q 10) 100 MG CAPS Take 100 mg by mouth daily.   Yes [provider]  Fluorouracil (TOLAK) 4 % CREA Apply 1 application topically daily.    Yes [provider]  gabapentin (NEURONTIN) 100 MG capsule Take 100 mg by mouth daily as needed (Nerve pain).  10/25/19  Yes [provider]  gabapentin (NEURONTIN) 600 MG tablet Take 600 mg by mouth 3 (three) times daily.  11/08/19  Yes [provider]  hydrochlorothiazide (HYDRODIURIL) 25 MG tablet Take 25 mg by mouth daily.  08/11/12  Yes [provider]  losartan (COZAAR) 100 MG tablet Take 100 mg by mouth daily.   Yes [provider]  mirtazapine (REMERON) 45 MG tablet Take 45 mg by mouth at bedtime.   Yes [provider]  Multiple Vitamin (MULTIVITAMIN) tablet Take 1 tablet by mouth daily. Centrum Silver   Yes [provider]  pantoprazole (PROTONIX) 40 MG tablet Take 40 mg by mouth 2 (two) times daily.   Yes [provider]  Probiotic Product (ALIGN) 4 MG CAPS Take 4 mg by mouth daily.   Yes [provider]  tamsulosin (FLOMAX) 0.4 MG CAPS capsule Take 0.4 mg by mouth daily.  04/11/13  Yes [provider]  terazosin (HYTRIN) 2 MG capsule Take 2 mg by mouth at bedtime.    Yes [provider]  Turmeric 500 MG CAPS Take 500 mg by mouth daily.    Yes [provider]  Wheat Dextrin (BENEFIBER PO) Take 1 Scoop by mouth daily.    Yes [provider]  zinc gluconate 50 MG tablet Take 50 mg by mouth daily.   Yes [provider]  polyethylene glycol-electrolytes (TRILYTE) 420 g  solution Take 4,000 mLs by mouth as directed. Patient not taking: Reported on 07/12/2020 06/24/20   Rourk, Cristopher Estimable, MD     Family History  Problem Relation Age of Onset   Prostate cancer Father    Diabetes Father    Hypertension Father    Stroke Father    Lung cancer Mother     Social History   Socioeconomic History   Marital status: Married    Spouse name: Not on file   Number of children: 2   Years of education: Not on file   Highest education level: Not on file  Occupational History   Occupation: retired; [employer]  Tobacco Use   Smoking status: Former Smoker    Packs/day: 3.00    Years: 30.00    Pack years: 90.00    Types: Cigarettes    Quit date: 08/16/1996    Years since quitting: 23.9   Smokeless tobacco: Former Systems developer    Types: [place] date: 11/01/1994  Vaping Use   Vaping Use: Never used  Substance and Sexual Activity   Alcohol use: Yes    Alcohol/week: 1.0 standard drink    Types: 1 Cans of beer per week    Comment: Rarely.   Drug use: No   Sexual activity: Not on file  Other Topics Concern   Not on file  Social History Narrative   Lives w/ wife   Social Determinants of Health   Financial Resource Strain:    Difficulty of Paying Living Expenses: Not on file  Food Insecurity:    Worried About [place] in the Last Year: Not on file   Ran Out of Food in the Last Year: Not on file  Transportation Needs:    Lack of Transportation (Medical): Not on file   Lack of Transportation (Non-Medical): Not on file  Physical Activity:    Days of Exercise per Week: Not on file   Minutes of Exercise per Session: Not on file  Stress:    Feeling of Stress : Not on file  Social Connections:    Frequency of Communication with Friends and Family: Not on file   Frequency of Social Gatherings with Friends and Family: Not on file   Attends Religious Services: Not on file   Active Member of Clubs or Organizations: Not on  file   Attends Archivist Meetings: Not on file   Marital Status: Not on file      Review of Systems see above; currently denies fever, headache, chest pain, dyspnea, cough, back pain, nausea, vomiting or bleeding.  Vital Signs: BP 137/74 (BP Location: Left Arm)    Pulse (!) 106    Temp 98.1 F (36.7 C) (Oral)    Resp 18    Ht 5\' 7"  (1.702 m)    SpO2 96%    BMI 22.52 kg/m   Physical Exam awake, alert.  Chest clear to auscultation bilaterally. Heart with regular rate and rhythm ;Abdomen soft, positive bowel sounds, some mild periumbilical tenderness to palpation.  No lower extremity edema. Imaging: CT ABDOMEN PELVIS WO CONTRAST  Result Date: 07/12/2020 CLINICAL DATA:  Abdominal pain EXAM: CT ABDOMEN AND PELVIS WITHOUT CONTRAST TECHNIQUE: Multidetector CT imaging of the abdomen and pelvis was performed following the standard protocol without IV contrast. COMPARISON:  10/21/2017 FINDINGS: Lower chest: Prominent subpleural fat again noted overlying the lateral aspect of the right lower lobe. Lung bases are clear. Heart size is normal. Hepatobiliary: Interval development of multiple rounded ill-defined low-density lesions scattered throughout the liver highly suspicious for metastatic disease. Unremarkable appearance of the gallbladder. No hyperdense stone or biliary dilatation. Pancreas: Unremarkable. No pancreatic ductal dilatation or surrounding inflammatory changes. Spleen: Normal in size without focal abnormality. Adrenals/Urinary Tract: Unremarkable adrenal glands. Bilateral kidneys are within normal limits. No renal stone or hydronephrosis. Nonspecific bilateral perinephric stranding is similar to prior. Urinary bladder is within normal limits. Stomach/Bowel: Evaluation of the bowel is somewhat limited in the absence of oral or intravenous contrast. Small hiatal hernia. Stomach otherwise appears within normal limits. There are no dilated loops of small bowel. Scattered colonic  diverticulosis. No focal bowel wall thickening or inflammatory changes. A normal appendix is seen adjacent to the tip of the right hepatic lobe (series 4, image 62). Vascular/Lymphatic: Scattered aortoiliac atherosclerotic calcifications without aneurysm. No abdominopelvic lymphadenopathy. Reproductive: Prostate gland is within normal limits. Other: No free fluid. No abdominopelvic fluid collection. No pneumoperitoneum. No abdominal wall hernia. Musculoskeletal: No acute osseous abnormality. No suspicious bone  lesion. IMPRESSION: 1. Interval development of multiple rounded ill-defined low-density lesions scattered throughout the liver highly suspicious for metastatic disease. No definite evidence of a primary malignancy within the abdomen or pelvis. 2. Colonic diverticulosis without evidence of acute diverticulitis. 3. Small hiatal hernia. 4. Aortic atherosclerosis. (ICD10-I70.0). Electronically Signed   By: Davina Poke D.O.   On: 07/12/2020 14:33   CT CHEST W CONTRAST  Result Date: 07/12/2020 CLINICAL DATA:  Cancer of unknown primary. EXAM: CT CHEST WITH CONTRAST TECHNIQUE: Multidetector CT imaging of the chest was performed during intravenous contrast administration. CONTRAST:  37mL OMNIPAQUE IOHEXOL 300 MG/ML  SOLN COMPARISON:  None. FINDINGS: Cardiovascular: No significant vascular findings. Normal heart size. No pericardial effusion. Mediastinum/Nodes: 2.5 cm x 1.5 cm and 2.1 cm x 1.4 cm pretracheal lymph nodes are seen. Thyroid gland, trachea, and esophagus demonstrate no significant findings. Lungs/Pleura: A 7.6 cm x 5.2 cm heterogeneous low-attenuation mass is seen within the posterior aspect of the right upper lobe. This extends to involve the right hilum. A 5 mm noncalcified lung nodule is seen within the posterior aspect of the left lung base (axial CT image 125, CT series number 5). A 5 mm ill-defined noncalcified lung nodule versus focal scar seen within the lateral aspect of the left lower  lobe (axial CT image 107, CT series number 5). There is no evidence of a pleural effusion or pneumothorax. A small pleural based area of fat attenuation is seen along the anterolateral aspect of the mid right lung. Upper Abdomen: Numerous heterogeneous low-attenuation liver lesions are seen scattered throughout the right and left lobes of the liver. The largest measures approximately 2.0 cm x 1.7 cm and is seen near the caudate lobe. There is a small hiatal hernia. Musculoskeletal: No chest wall abnormality. No acute or significant osseous findings. IMPRESSION: 1. Posterior right upper lobe lung mass which extends to involve the right hilum, consistent with primary lung malignancy. 2. Numerous heterogeneous low-attenuation liver lesions, consistent with metastatic disease. 3. 5 mm noncalcified left lower lobe and left basilar noncalcified lung nodule versus focal scar. Electronically Signed   By: Virgina Norfolk M.D.   On: 07/12/2020 17:43   DG Chest Portable 1 View  Result Date: 07/12/2020 CLINICAL DATA:  Cough EXAM: PORTABLE CHEST 1 VIEW COMPARISON:  Chest x-ray 01/02/2018 FINDINGS: The heart size and mediastinal contours are within normal limits. Interval development of a right upper lobe airspace opacity. Suggestion of thickened right paratracheal stripe. No pulmonary edema. No pleural effusion. No pneumothorax. No acute osseous abnormality. IMPRESSION: Interval development of right upper lobe airspace opacity and suggestion of thickened right paratracheal stripe. Finding could represent infection/inflammation with underlying malignancy not excluded. Recommend CT chest with intravenous contrast for further evaluation. Electronically Signed   By: Iven Finn M.D.   On: 07/12/2020 15:29    Labs:  CBC: Recent Labs    07/12/20 1339 07/13/20 0602 07/14/20 0532  WBC 12.4* 9.0 9.2  HGB 10.7* 10.1* 10.7*  HCT 33.0* 31.9* 33.5*  PLT 607* 535* 570*    COAGS: Recent Labs    07/14/20 1048   INR 1.0    BMP: Recent Labs    07/12/20 1339 07/13/20 0602 07/14/20 0532  NA 137 139 142  K 3.8 3.7 3.8  CL 102 105 108  CO2 28 24 22   GLUCOSE 176* 99 121*  BUN 12 9 10   CALCIUM 9.2 9.5 9.9  CREATININE 1.09 0.94 0.99  GFRNONAA >60 >60 >60    LIVER FUNCTION TESTS: Recent  Labs    07/12/20 1339  BILITOT 0.8  AST 25  ALT 13  ALKPHOS 81  PROT 7.1  ALBUMIN 2.9*    TUMOR MARKERS: No results for input(s): AFPTM, CEA, CA199, CHROMGRNA in the last 8760 hours.  Assessment and Plan: 69 y.o. 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  CT scan of abdomen pelvis revealed multiple lesions throughout the liver, colonic diverticulosis without diverticulitis, small hiatal hernia.  CT chest revealed posterior right upper lobe lung mass which extends to involve the right hilum, 5 mm noncalcified left lower lobe and left basilar noncalcified lung nodule versus focal scar.  EGD and colonoscopy negative for malignancy.  Request now received for image guided liver lesion biopsy for further evaluation. Imaging studies were reviewed by Dr. Pascal Lux.  Risks and benefits of procedure was discussed with the patient /spouse including, but not limited to bleeding, infection, damage to adjacent structures or low yield requiring additional tests.  All of the questions were answered and there is agreement to proceed.  Consent signed and in chart.  Procedure scheduled for 11/30   Thank you for this interesting consult.  I greatly enjoyed meeting Lee Christensen and look forward to participating in their care.  A copy of this report was sent to the requesting provider on this date.  Electronically Signed: D. Rowe Robert, PA-C 07/14/2020, 2:23 PM   I spent a total of  30 minutes   in  face to face in clinical consultation, greater than 50% of which was counseling/coordinating care for image guided liver lesion biopsy

## 2020-07-14 NOTE — Anesthesia Postprocedure Evaluation (Signed)
Anesthesia Post Note  Patient: Lee Christensen  Procedure(s) Performed: ESOPHAGOGASTRODUODENOSCOPY (EGD) (Left ) COLONOSCOPY (Left ) BALLOON DILATION (N/A ) BIOPSY     Patient location during evaluation: PACU Anesthesia Type: MAC Level of consciousness: awake and alert Pain management: pain level controlled Vital Signs Assessment: post-procedure vital signs reviewed and stable Respiratory status: spontaneous breathing, nonlabored ventilation, respiratory function stable and patient connected to nasal cannula oxygen Cardiovascular status: stable and blood pressure returned to baseline Postop Assessment: no apparent nausea or vomiting Anesthetic complications: no   No complications documented.  Last Vitals:  Vitals:   07/14/20 1005 07/14/20 1010  BP: (!) 87/53 104/60  Pulse: 66 64  Resp: 16 17  Temp: 37.3 C   SpO2: 100% 100%    Last Pain:  Vitals:   07/14/20 1010  TempSrc:   PainSc: 0-No pain                 Kandice Schmelter S

## 2020-07-14 NOTE — Progress Notes (Signed)
Initial Nutrition Assessment  INTERVENTION:   -Ensure Enlive po BID, each supplement provides 350 kcal and 20 grams of protein  NUTRITION DIAGNOSIS:   Increased nutrient needs related to cancer and cancer related treatments as evidenced by estimated needs.  GOAL:   Patient will meet greater than or equal to 90% of their needs  MONITOR:   PO intake, Supplement acceptance, Labs, Weight trends, I & O's  REASON FOR ASSESSMENT:   Consult Assessment of nutrition requirement/status  ASSESSMENT:   69 year old gentleman with history of hypertension, diabetes, chronic back pain, recent H. pylori and diagnosis of GERD presented to the ER with intermittent right upper quadrant abdominal pain, low back pain and weight loss of more than 30 pounds in last 2 to 3 months.  Poor appetite.  Patient was scheduled for EGD and colonoscopy for work-up of weight loss and poor appetite next week.  In the emergency room, afebrile.  Hemodynamically stable.  On room air.  CT scan chest abdomen pelvis showed 7 cm right upper lobe tumor along with numerous metastatic tumor on his liver.  Admitted for symptom control and diagnosis.  Patient is s/p EGD and colonoscopy this morning. Per GI note, no malignancy in colon or UGI noted. Pt with diverticulosis. Thrush and possible Barrett's esophagus noted as well.  Pt now planned for bronchoscopy. Pulmonology recommending liver biopsy. Will continue to monitor plan of care.  Per chart review, pt has had abdominal pain that worsens with eating. Pt has eaten very poorly for the past 3-5 months d/t this.   Last recorded weight is 143 lbs on 11/9. Needs weight for admission. Pt reports weight loss of 20-30 lbs over the past 3-4 months. Suspect some degree of malnutrition but unable to diagnose at this time.  Labs reviewed. Medications: Remeron  NUTRITION - FOCUSED PHYSICAL EXAM:  Unable to complete  Diet Order:   Diet Order            Diet Heart Room service  appropriate? Yes; Fluid consistency: Thin  Diet effective now                 EDUCATION NEEDS:   Not appropriate for education at this time  Skin:  Skin Assessment: Reviewed RN Assessment  Last BM:  11/29  Height:   Ht Readings from Last 1 Encounters:  07/12/20 5\' 7"  (1.702 m)    Weight:   Wt Readings from Last 1 Encounters:  06/24/20 65.2 kg    BMI:  Body mass index is 22.52 kg/m.  Estimated Nutritional Needs:   Kcal:  1900-2100  Protein:  90-100g  Fluid:  2L/day   Clayton Bibles, MS, RD, LDN Inpatient Clinical Dietitian Contact information available via Amion

## 2020-07-14 NOTE — Anesthesia Procedure Notes (Signed)
Procedure Name: MAC Date/Time: 07/14/2020 9:11 AM Performed by: Niel Hummer, CRNA Pre-anesthesia Checklist: Patient identified, Emergency Drugs available, Suction available and Patient being monitored Oxygen Delivery Method: Simple face mask

## 2020-07-14 NOTE — Progress Notes (Signed)
PROGRESS NOTE    Eder Macek  IEP:[id number] DOB: 1951/04/14 DOA: 07/12/2020 PCP: Redmond School, MD    Brief Narrative:  69 year old gentleman with history of hypertension, diabetes, chronic back pain, recent H. pylori and diagnosis of GERD presented to the ER with intermittent right upper quadrant abdominal pain, low back pain and weight loss of more than 30 pounds in last 2 to 3 months.  Poor appetite.  Patient was scheduled for EGD and colonoscopy for work-up of weight loss and poor appetite next week.  In the emergency room, afebrile.  Hemodynamically stable.  On room air.  CT scan chest abdomen pelvis showed 7 cm right upper lobe tumor along with numerous metastatic tumor on his liver.  Admitted for symptom control and diagnosis.   Assessment & Plan:   Principal Problem:   Pain management Active Problems:   Abdominal pain   Back pain   GERD (gastroesophageal reflux disease)   Palliative care by specialist   Goals of care, counseling/discussion   Non-small cell lung cancer metastatic to liver [facility])   Liver mass   Lung mass   Abnormal CT of the abdomen   Iron deficiency anemia   Esophageal dysphagia   Schatzki's ring  Suspected bronchogenic cancer with liver metastasis.  Metastatic pain. -Followed by oncology. -Adequate pain management, will escalate oral opiates for pain control.  Continue IV morphine until good pain control is achieved. -EGD colonoscopy today, no concerning malignant-looking lesion.  He does definitely have dysphagia related to Barrett's esophagus.  On PPI twice a day. -Metastatic lesion in the liver, contrasted CT scan abdomen pelvis today and liver biopsy with IR today. -Probable home with tissue biopsy and follow-up with oncology for results.  Failure to thrive.  Cancer anorexia: -Nutrition to see. -May need upper GI endoscopy to evaluate for any strictures or GI tumor.  Anxiety/insomnia: Continue chronic benzo therapy.  Continue.  Hypertension:  Stable on hydrochlorothiazide, amlodipine losartan.  BPH: On Flomax.  Continue.   Called and discussed with [facility] radiology, Dr. Owens Shark about doing biopsy. Called and discussed with pulmonary Dr. Shearon Stalls.  DVT prophylaxis: Lovenox.  Hold today for biopsy.   Code Status: Full code Family Communication: Wife at the bedside Disposition Plan: Status is: Inpatient  Remains inpatient appropriate because:Inpatient level of care appropriate due to severity of illness   Dispo: The patient is from: Home              Anticipated d/c is to: Home              Anticipated d/c date is: Anticipate tomorrow.              Patient currently is not medically stable to d/c.  EGD and colonoscopy today.  Anticipate liver biopsy today.  Anticipate bone scan tomorrow and discharge with follow-up with oncology.    Consultants:   Palliative medicine  Oncology  Gastroenterology  Procedures:   None  Antimicrobials:   None   Subjective: Seen and examined.  He said he did not get good night sleep last night.  Was going for endoscopy.  I discussed with patient and wife about pulmonary recommendation doing a liver biopsy and they agreed.  Objective: Vitals:   07/14/20 0827 07/14/20 1005 07/14/20 1010 07/14/20 1020  BP: (!) 174/78 (!) 87/53 104/60   Pulse: (!) 105 66 64 97  Resp: 14 16 17 19   Temp: 99.3 F (37.4 C) 99.1 F (37.3 C)    TempSrc: Oral Axillary  SpO2: 97% 100% 100% 97%  Height:        Intake/Output Summary (Last 24 hours) at 07/14/2020 1106 Last data filed at 07/14/2020 1003 Gross per 24 hour  Intake 2532.98 ml  Output 600 ml  Net 1932.98 ml   There were no vitals filed for this visit.  Examination:  General exam: Chronically sick looking.  Anxious. Thin and frail.  Not in any distress. Respiratory system: Clear to auscultation. Respiratory effort normal.  No added sounds. Cardiovascular system: S1 & S2 heard, RRR.  Gastrointestinal system: Soft.   Nontender.   Central nervous system: Alert and oriented. No focal neurological deficits. Extremities: Symmetric 5 x 5 power. Skin: No rashes, lesions or ulcers Psychiatry: Judgement and insight appear normal. Mood & affect anxious.    Data Reviewed: I have personally reviewed following labs and imaging studies  CBC: Recent Labs  Lab 07/12/20 1339 07/13/20 0602 07/14/20 0532  WBC 12.4* 9.0 9.2  NEUTROABS 10.5*  --   --   HGB 10.7* 10.1* 10.7*  HCT 33.0* 31.9* 33.5*  MCV 88.9 89.1 89.1  PLT 607* 535* 053*   Basic Metabolic Panel: Recent Labs  Lab 07/12/20 1339 07/13/20 0602 07/14/20 0532  NA 137 139 142  K 3.8 3.7 3.8  CL 102 105 108  CO2 28 24 22   GLUCOSE 176* 99 121*  BUN 12 9 10   CREATININE 1.09 0.94 0.99  CALCIUM 9.2 9.5 9.9   GFR: CrCl cannot be calculated (Unknown ideal weight.). Liver Function Tests: Recent Labs  Lab 07/12/20 1339  AST 25  ALT 13  ALKPHOS 81  BILITOT 0.8  PROT 7.1  ALBUMIN 2.9*   Recent Labs  Lab 07/12/20 1339  LIPASE 22   No results for input(s): AMMONIA in the last 168 hours. Coagulation Profile: No results for input(s): INR, PROTIME in the last 168 hours. Cardiac Enzymes: No results for input(s): CKTOTAL, CKMB, CKMBINDEX, TROPONINI in the last 168 hours. BNP (last 3 results) No results for input(s): PROBNP in the last 8760 hours. HbA1C: No results for input(s): HGBA1C in the last 72 hours. CBG: No results for input(s): GLUCAP in the last 168 hours. Lipid Profile: No results for input(s): CHOL, HDL, LDLCALC, TRIG, CHOLHDL, LDLDIRECT in the last 72 hours. Thyroid Function Tests: No results for input(s): TSH, T4TOTAL, FREET4, T3FREE, THYROIDAB in the last 72 hours. Anemia Panel: Recent Labs    07/13/20 0602  FERRITIN 132  TIBC 236*  IRON 20*   Sepsis Labs: No results for input(s): PROCALCITON, LATICACIDVEN in the last 168 hours.  Recent Results (from the past 240 hour(s))  Resp Panel by RT-PCR (Flu A&B, Covid)  Nasopharyngeal Swab     Status: None   Collection Time: 07/12/20  3:26 PM   Specimen: Nasopharyngeal Swab; Nasopharyngeal(NP) swabs in vial transport medium  Result Value Ref Range Status   SARS Coronavirus 2 by RT PCR NEGATIVE NEGATIVE Final    Comment: (NOTE) SARS-CoV-2 target nucleic acids are NOT DETECTED.  The SARS-CoV-2 RNA is generally detectable in upper respiratory specimens during the acute phase of infection. The lowest concentration of SARS-CoV-2 viral copies this assay can detect is 138 copies/mL. A negative result does not preclude SARS-Cov-2 infection and should not be used as the sole basis for treatment or other patient management decisions. A negative result may occur with  improper specimen collection/handling, submission of specimen other than nasopharyngeal swab, presence of viral mutation(s) within the areas targeted by this assay, and inadequate number of viral copies(<138  copies/mL). A negative result must be combined with clinical observations, patient history, and epidemiological information. The expected result is Negative.  Fact Sheet for Patients:  EntrepreneurPulse.com.au  Fact Sheet for Healthcare Providers:  IncredibleEmployment.be  This test is no t yet approved or cleared by the [place] FDA and  has been authorized for detection and/or diagnosis of SARS-CoV-2 by FDA under an Emergency Use Authorization (EUA). This EUA will remain  in effect (meaning this test can be used) for the duration of the COVID-19 declaration under Section 564(b)(1) of the Act, 21 U.S.C.section 360bbb-3(b)(1), unless the authorization is terminated  or revoked sooner.       Influenza A by PCR NEGATIVE NEGATIVE Final   Influenza B by PCR NEGATIVE NEGATIVE Final    Comment: (NOTE) The Xpert Xpress SARS-CoV-2/FLU/RSV plus assay is intended as an aid in the diagnosis of influenza from Nasopharyngeal swab specimens and should not be  used as a sole basis for treatment. Nasal washings and aspirates are unacceptable for Xpert Xpress SARS-CoV-2/FLU/RSV testing.  Fact Sheet for Patients: EntrepreneurPulse.com.au  Fact Sheet for Healthcare Providers: IncredibleEmployment.be  This test is not yet approved or cleared by the [place] FDA and has been authorized for detection and/or diagnosis of SARS-CoV-2 by FDA under an Emergency Use Authorization (EUA). This EUA will remain in effect (meaning this test can be used) for the duration of the COVID-19 declaration under Section 564(b)(1) of the Act, 21 U.S.C. section 360bbb-3(b)(1), unless the authorization is terminated or revoked.  Performed at [facility], Portal [address] [place], [place] [address]          Radiology Studies: CT ABDOMEN PELVIS WO CONTRAST  Result Date: 07/12/2020 CLINICAL DATA:  Abdominal pain EXAM: CT ABDOMEN AND PELVIS WITHOUT CONTRAST TECHNIQUE: Multidetector CT imaging of the abdomen and pelvis was performed following the standard protocol without IV contrast. COMPARISON:  10/21/2017 FINDINGS: Lower chest: Prominent subpleural fat again noted overlying the lateral aspect of the right lower lobe. Lung bases are clear. Heart size is normal. Hepatobiliary: Interval development of multiple rounded ill-defined low-density lesions scattered throughout the liver highly suspicious for metastatic disease. Unremarkable appearance of the gallbladder. No hyperdense stone or biliary dilatation. Pancreas: Unremarkable. No pancreatic ductal dilatation or surrounding inflammatory changes. Spleen: Normal in size without focal abnormality. Adrenals/Urinary Tract: Unremarkable adrenal glands. Bilateral kidneys are within normal limits. No renal stone or hydronephrosis. Nonspecific bilateral perinephric stranding is similar to prior. Urinary bladder is within normal limits. Stomach/Bowel: Evaluation of the bowel  is somewhat limited in the absence of oral or intravenous contrast. Small hiatal hernia. Stomach otherwise appears within normal limits. There are no dilated loops of small bowel. Scattered colonic diverticulosis. No focal bowel wall thickening or inflammatory changes. A normal appendix is seen adjacent to the tip of the right hepatic lobe (series 4, image 62). Vascular/Lymphatic: Scattered aortoiliac atherosclerotic calcifications without aneurysm. No abdominopelvic lymphadenopathy. Reproductive: Prostate gland is within normal limits. Other: No free fluid. No abdominopelvic fluid collection. No pneumoperitoneum. No abdominal wall hernia. Musculoskeletal: No acute osseous abnormality. No suspicious bone lesion. IMPRESSION: 1. Interval development of multiple rounded ill-defined low-density lesions scattered throughout the liver highly suspicious for metastatic disease. No definite evidence of a primary malignancy within the abdomen or pelvis. 2. Colonic diverticulosis without evidence of acute diverticulitis. 3. Small hiatal hernia. 4. Aortic atherosclerosis. (ICD10-I70.0). Electronically Signed   By: Davina Poke D.O.   On: 07/12/2020 14:33   CT CHEST W CONTRAST  Result Date: 07/12/2020  CLINICAL DATA:  Cancer of unknown primary. EXAM: CT CHEST WITH CONTRAST TECHNIQUE: Multidetector CT imaging of the chest was performed during intravenous contrast administration. CONTRAST:  50mL OMNIPAQUE IOHEXOL 300 MG/ML  SOLN COMPARISON:  None. FINDINGS: Cardiovascular: No significant vascular findings. Normal heart size. No pericardial effusion. Mediastinum/Nodes: 2.5 cm x 1.5 cm and 2.1 cm x 1.4 cm pretracheal lymph nodes are seen. Thyroid gland, trachea, and esophagus demonstrate no significant findings. Lungs/Pleura: A 7.6 cm x 5.2 cm heterogeneous low-attenuation mass is seen within the posterior aspect of the right upper lobe. This extends to involve the right hilum. A 5 mm noncalcified lung nodule is seen within  the posterior aspect of the left lung base (axial CT image 125, CT series number 5). A 5 mm ill-defined noncalcified lung nodule versus focal scar seen within the lateral aspect of the left lower lobe (axial CT image 107, CT series number 5). There is no evidence of a pleural effusion or pneumothorax. A small pleural based area of fat attenuation is seen along the anterolateral aspect of the mid right lung. Upper Abdomen: Numerous heterogeneous low-attenuation liver lesions are seen scattered throughout the right and left lobes of the liver. The largest measures approximately 2.0 cm x 1.7 cm and is seen near the caudate lobe. There is a small hiatal hernia. Musculoskeletal: No chest wall abnormality. No acute or significant osseous findings. IMPRESSION: 1. Posterior right upper lobe lung mass which extends to involve the right hilum, consistent with primary lung malignancy. 2. Numerous heterogeneous low-attenuation liver lesions, consistent with metastatic disease. 3. 5 mm noncalcified left lower lobe and left basilar noncalcified lung nodule versus focal scar. Electronically Signed   By: Virgina Norfolk M.D.   On: 07/12/2020 17:43   DG Chest Portable 1 View  Result Date: 07/12/2020 CLINICAL DATA:  Cough EXAM: PORTABLE CHEST 1 VIEW COMPARISON:  Chest x-ray 01/02/2018 FINDINGS: The heart size and mediastinal contours are within normal limits. Interval development of a right upper lobe airspace opacity. Suggestion of thickened right paratracheal stripe. No pulmonary edema. No pleural effusion. No pneumothorax. No acute osseous abnormality. IMPRESSION: Interval development of right upper lobe airspace opacity and suggestion of thickened right paratracheal stripe. Finding could represent infection/inflammation with underlying malignancy not excluded. Recommend CT chest with intravenous contrast for further evaluation. Electronically Signed   By: Iven Finn M.D.   On: 07/12/2020 15:29        Scheduled  Meds: . amLODipine  5 mg Oral Daily  . atorvastatin  20 mg Oral Daily  . gabapentin  600 mg Oral TID  . hydrochlorothiazide  25 mg Oral Daily  . iohexol  500 mL Oral Q1H  . iohexol      . losartan  100 mg Oral Daily  . mirtazapine  45 mg Oral QHS  . nystatin  5 mL Oral QID  . tamsulosin  0.4 mg Oral Daily  . terazosin  2 mg Oral QHS   Continuous Infusions:   LOS: 1 day    Time spent: 35 minutes     Barb Merino, MD [facility] Pager [phone]

## 2020-07-14 NOTE — Interval H&P Note (Signed)
History and Physical Interval Note: EGD/colon today to eval IDA with hx of GERD, weight loss, decreased appetite, abd pain.  Concern for primary lung malignancy.   The nature of the procedure, as well as the risks, benefits, and alternatives were carefully and thoroughly reviewed with the patient. Ample time for discussion and questions allowed. The patient understood, was satisfied, and agreed to proceed.    07/14/2020 8:53 AM  Lee Christensen  has presented today for surgery, with the diagnosis of Abdominal pain, IDA, GERD.  The various methods of treatment have been discussed with the patient and family. After consideration of risks, benefits and other options for treatment, the patient has consented to  Procedure(s): ESOPHAGOGASTRODUODENOSCOPY (EGD) (Left) COLONOSCOPY (Left) as a surgical intervention.  The patient's history has been reviewed, patient examined, no change in status, stable for surgery.  I have reviewed the patient's chart and labs.  Questions were answered to the patient's satisfaction.     Lajuan Lines Lee Christensen

## 2020-07-14 NOTE — Progress Notes (Signed)
Chaplain engaged in initial visit with Lee Christensen and his wife Lee Christensen.  Chaplain explained her role and offered support.  Hiram let chaplain know that he and his wife had been having a discussion about a couple of things and it may not be a good time.  Chaplain offered prayer before leaving and they accepted.  Chaplain prayed over Lee Christensen and wife and continued to offer support.  Lee Christensen and his wife are believers and his pastor is aware he is in the hospital.  They attend a very small congregation in [place] and live close to their pastor and church.  Chaplain will continue to follow-up.     07/14/20 1100  Clinical Encounter Type  Visited With Patient and family together  Visit Type Initial  Spiritual Encounters  Spiritual Needs Prayer

## 2020-07-14 NOTE — Transfer of Care (Signed)
Immediate Anesthesia Transfer of Care Note  Patient: Lee Christensen  Procedure(s) Performed: ESOPHAGOGASTRODUODENOSCOPY (EGD) (Left ) COLONOSCOPY (Left ) BALLOON DILATION (N/A ) BIOPSY  Patient Location: PACU  Anesthesia Type:MAC  Level of Consciousness: awake  Airway & Oxygen Therapy: Patient Spontanous Breathing and Patient connected to face mask oxygen  Post-op Assessment: Report given to RN, Post -op Vital signs reviewed and stable and Patient moving all extremities X 4  Post vital signs: Reviewed and stable  Last Vitals:  Vitals Value Taken Time  BP    Temp    Pulse 71 07/14/20 1003  Resp 13 07/14/20 1003  SpO2 100 % 07/14/20 1003  Vitals shown include unvalidated device data.  Last Pain:  Vitals:   07/14/20 0827  TempSrc: Oral  PainSc:       Patients Stated Pain Goal: 2 (13/14/38 8875)  Complications: No complications documented.

## 2020-07-14 NOTE — Anesthesia Preprocedure Evaluation (Signed)
Anesthesia Evaluation  Patient identified by MRN, date of birth, ID band Patient awake    Reviewed: Allergy & Precautions, NPO status , Patient's Chart, lab work & pertinent test results  Airway Mallampati: II  TM Distance: >3 FB Neck ROM: Full    Dental no notable dental hx.    Pulmonary former smoker,  Non-small cell lung cancer metastatic to liver [facility]   Pulmonary exam normal breath sounds clear to auscultation       Cardiovascular hypertension, Normal cardiovascular exam Rhythm:Regular Rate:Normal     Neuro/Psych negative neurological ROS  negative psych ROS   GI/Hepatic Neg liver ROS, GERD  ,  Endo/Other  diabetes  Renal/GU negative Renal ROS  negative genitourinary   Musculoskeletal negative musculoskeletal ROS (+)   Abdominal   Peds negative pediatric ROS (+)  Hematology  (+) anemia ,   Anesthesia Other Findings   Reproductive/Obstetrics negative OB ROS                             Anesthesia Physical Anesthesia Plan  ASA: III  Anesthesia Plan: MAC   Post-op Pain Management:    Induction: Intravenous  PONV Risk Score and Plan: 0  Airway Management Planned: Simple Face Mask  Additional Equipment:   Intra-op Plan:   Post-operative Plan:   Informed Consent: I have reviewed the patients History and Physical, chart, labs and discussed the procedure including the risks, benefits and alternatives for the proposed anesthesia with the patient or authorized representative who has indicated his/her understanding and acceptance.     Dental advisory given  Plan Discussed with: CRNA and Surgeon  Anesthesia Plan Comments:         Anesthesia Quick Evaluation

## 2020-07-14 NOTE — Op Note (Signed)
[facility] Patient Name: Lee Christensen Procedure Date: 07/14/2020 MRN: [id number] Attending MD: Jerene Bears , MD Date of Birth: 1950/12/14 CSN: [id number] Age: 69 Admit Type: Outpatient Procedure:                Colonoscopy Indications:              Abdominal pain, Iron deficiency anemia, Weight                            loss, abnormal imaging of the chest and abdomen by                            CT suggesting primary lung malignancy with liver                            involvement Providers:                Lajuan Lines. Hilarie Fredrickson, MD, Janee Morn, Technician,                            Cletis Athens, Technician, Cleda Daub, RN Referring MD:             [facility] Medicines:                Monitored Anesthesia Care Complications:            No immediate complications. Estimated Blood Loss:     Estimated blood loss: none. Procedure:                Pre-Anesthesia Assessment:                           - Prior to the procedure, a History and Physical                            was performed, and patient medications and                            allergies were reviewed. The patient's tolerance of                            previous anesthesia was also reviewed. The risks                            and benefits of the procedure and the sedation                            options and risks were discussed with the patient.                            All questions were answered, and informed consent                            was obtained. Prior Anticoagulants: The patient has  taken no previous anticoagulant or antiplatelet                            agents. ASA Grade Assessment: III - A patient with                            severe systemic disease. After reviewing the risks                            and benefits, the patient was deemed in                            satisfactory condition to undergo the procedure.                            After obtaining informed consent, the colonoscope                            was passed under direct vision. Throughout the                            procedure, the patient's blood pressure, pulse, and                            oxygen saturations were monitored continuously. The                            [id number]-HQ190L ([id number]) Olympus colonoscope was                            introduced through the anus and advanced to the                            cecum, identified by appendiceal orifice and                            ileocecal valve. The colonoscopy was performed                            without difficulty. The patient tolerated the                            procedure well. The quality of the bowel                            preparation was good after irrigation and lavage.                            The ileocecal valve, appendiceal orifice, and                            rectum were photographed. Scope In: 9:42:01 AM Scope Out: 9:56:01 AM Scope Withdrawal Time: 0 hours 8 minutes 53 seconds  Total Procedure Duration: 0 hours 14 minutes 0  seconds  Findings:      The digital rectal exam was normal.      Multiple small and large-mouthed diverticula were found in the sigmoid       colon, descending colon and transverse colon.      Internal hemorrhoids were found during retroflexion. The hemorrhoids       were medium-sized.      The exam was otherwise without abnormality. Impression:               - Diverticulosis in the sigmoid colon, in the                            descending colon and in the transverse colon.                           - Internal hemorrhoids.                           - The examination was otherwise normal.                           - No evidence of malignancy in the colon.                           - No specimens collected. Moderate Sedation:      N/A Recommendation:           - Return patient to hospital ward for ongoing care.                           -  Resume previous diet.                           - Continue present medications.                           - No repeat colonoscopy due to age and the absence                            of colonic polyps. Procedure Code(s):        --- Professional ---                           [phone], Colonoscopy, flexible; diagnostic, including                            collection of specimen(s) by brushing or washing,                            when performed (separate procedure) Diagnosis Code(s):        --- Professional ---                           H96.2, Other hemorrhoids                           R10.9, Unspecified abdominal pain  D50.9, Iron deficiency anemia, unspecified                           R63.4, Abnormal weight loss                           K57.30, Diverticulosis of large intestine without                            perforation or abscess without bleeding CPT copyright 2019 [employer]. All rights reserved. The codes documented in this report are preliminary and upon coder review may  be revised to meet current compliance requirements. Jerene Bears, MD 07/14/2020 10:04:36 AM This report has been signed electronically. Number of Addenda: 0

## 2020-07-14 NOTE — Op Note (Signed)
[facility] Patient Name: Lee Christensen Procedure Date: 07/14/2020 MRN: [id number] Attending MD: Jerene Bears , MD Date of Birth: 05-06-1951 CSN: [id number] Age: 69 Admit Type: Outpatient Procedure:                Upper GI endoscopy Indications:              Abdominal pain, Dysphagia (intermittent solid                            food), Gastro-esophageal reflux disease, Weight                            loss, abnormal CT scan chest and abdomen with                            concern for primary lung malignancy with liver                            involvement, IDA Providers:                Lajuan Lines. Hilarie Fredrickson, MD, Cleda Daub, RN, William Dalton, Technician Referring MD:             [facility] Medicines:                Monitored Anesthesia Care Complications:            No immediate complications. Estimated Blood Loss:     Estimated blood loss was minimal. Procedure:                Pre-Anesthesia Assessment:                           - Prior to the procedure, a History and Physical                            was performed, and patient medications and                            allergies were reviewed. The patient's tolerance of                            previous anesthesia was also reviewed. The risks                            and benefits of the procedure and the sedation                            options and risks were discussed with the patient.                            All questions were answered, and informed consent  was obtained. Prior Anticoagulants: The patient has                            taken no previous anticoagulant or antiplatelet                            agents. ASA Grade Assessment: III - A patient with                            severe systemic disease. After reviewing the risks                            and benefits, the patient was deemed in                            satisfactory  condition to undergo the procedure.                           After obtaining informed consent, the endoscope was                            passed under direct vision. Throughout the                            procedure, the patient's blood pressure, pulse, and                            oxygen saturations were monitored continuously. The                            GIF-H190 ([id number]) Olympus gastroscope was                            introduced through the mouth, and advanced to the                            second part of duodenum. The upper GI endoscopy was                            accomplished without difficulty. The patient                            tolerated the procedure well. Scope In: Scope Out: Findings:      Ritta Slot was found at the glottis.      The Z-line was irregular and was found 36 cm from the incisors. Likely       short-segment Barrett's esophagus. Biopsies were taken with a cold       forceps for histology.      A low-grade of narrowing Schatzki ring was found at the gastroesophageal       junction at 37 cm. A TTS dilator was passed through the scope. Dilation       with a 15-16.5-18 mm balloon dilator was performed to 18 mm. The       dilation site was examined and showed  mild mucosal disruption.      A 2 cm hiatal hernia was present (37-39 cm from incisors).      The entire examined stomach was normal.      A large non-bleeding diverticulum was found in the second portion of the       duodenum.      The exam of the duodenum was otherwise normal. Impression:               - Thrush was visualized at the glottis.                           - Z-line irregular, 36 cm from the incisors.                            Biopsied.                           - Low-grade of narrowing Schatzki ring. Dilated.                           - 2 cm hiatal hernia.                           - Normal stomach.                           - Non-bleeding duodenal diverticulum.                            - No evidence of malignancy in the examined UGI                            tract. Moderate Sedation:      N/A Recommendation:           - Return patient to hospital ward for ongoing care.                           - Resume previous diet.                           - Continue present medications.                           - Await pathology results.                           - See the other procedure note for documentation of                            additional recommendations. Procedure Code(s):        --- Professional ---                           [id number], Esophagogastroduodenoscopy, flexible,                            transoral; with transendoscopic balloon dilation of  esophagus (less than 30 mm diameter)                           43239, 59, Esophagogastroduodenoscopy, flexible,                            transoral; with biopsy, single or multiple Diagnosis Code(s):        --- Professional ---                           B37.0, Candidal stomatitis                           K22.8, Other specified diseases of esophagus                           K22.2, Esophageal obstruction                           K44.9, Diaphragmatic hernia without obstruction or                            gangrene                           R10.9, Unspecified abdominal pain                           R13.10, Dysphagia, unspecified                           R63.4, Abnormal weight loss                           K57.10, Diverticulosis of small intestine without                            perforation or abscess without bleeding CPT copyright 2019 American Medical Association. All rights reserved. The codes documented in this report are preliminary and upon coder review may  be revised to meet current compliance requirements. Jerene Bears, MD 07/14/2020 9:41:10 AM This report has been signed electronically. Number of Addenda: 0

## 2020-07-14 NOTE — Procedures (Addendum)
I called and spoke to the patient's wife, Helene Kelp by phone We discussed the upper endoscopy and colonoscopy findings and results I am treating his oral thrush with nystatin suspension We will continue pantoprazole 40 mg but increase to twice daily before meals to try to control reflux symptoms.  If this is not sufficient and we could consider changing to Dexilant 60 mg after discharge GI will sign off, call with questions

## 2020-07-14 NOTE — Progress Notes (Signed)
Two procedures were scheduled for 07/16/2020, one was canceled and one was not. Patient is currently at [facility] and has been since last Saturday. The Gastro Dr. Parks Ranger is seeing him in the hospital has already preformed a Colonoscopy on the patient.  FYI so you don't have to read through all those notes. Nothing further needed.

## 2020-07-14 NOTE — Progress Notes (Signed)
Mr. Costilla will be going for either endoscopy today or bronchoscopy.  He was seen by both Gastroenterology and Pulmonary Medicine yesterday.  He is also scheduled for a bone scan.  I suspect this probably will be done tomorrow.  He is still quite anxious.  He is not eating all that much because of the abdominal discomfort.  His CEA is elevated.  As such, more think this is going to be a adenocarcinoma.  He is iron deficient.  His iron saturation is only 8%.  We will going give him a dose of IV iron.  He currently is in the bathroom having his bowel prep work.  He has had no bleeding.  There is no problems with cough.  There is no hemoptysis.  We are sort of in a "holding pattern" until we know what the biopsy shows with respect to his malignancy.  I have to believe that this is going to be malignant.  Lattie Haw, MD  Michaelyn Barter 2:10

## 2020-07-14 NOTE — Progress Notes (Signed)
NAME:  Lee Christensen, MRN:  [id number], DOB:  Nov 23, 1950, LOS: 1 ADMISSION DATE:  07/12/2020, CONSULTATION DATE:  11/28 REFERRING MD:  [facility] , CHIEF COMPLAINT:  RUQ pain    Brief History   69 yowm remote smoker with sev months wt oss with intermittent RUQ pain with w/u c/w primary lung ca RUL with ? 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 pylori and GERD who presented to the ED with intermittent abdominal pain and low back pain for the past several months which has been worsening recently > abdominal pain is worse after eating and radiates to the right side of his back.  No nausea vomiting or diarrhea, fever or chills.  Takes Tylenol with minimal relief and has been trying ice packs with some relief but as of lately has not been helping.  Pain is worse at night as well.  Admits to 20-30 pound weight loss over the past several months and at least 10 pounds in the past 2 weeks which is unintentional and due to avoiding food.  Patient states he is a former smoker and quit in the 90s.  Of note, he has extensive exposure to asbestos as he used to work as a Chief Strategy Officer.     ED Course: Afebrile, tachycardic, hemodynamically stable, on room air. Notable Labs: WBC 12.4, Hb 10.7 otherwise unremarkable. Notable Imaging: CT abdomen pelvis without contrast: Interval development of multiple round ill-defined low-density lesions throughout the liver suspicious for metastatic disease.  A CXR was ordered which showed interval development of RUL airspace opacity and suggestion of thickened right paratracheal stripe and CT chest was recommended by radiology.  CT chest with contrast: Posterior RUL lung mass which extends to involve the right hilum consistent with  primary lung malignancy and 5 mm noncalcified LLL and left basilar noncalcified lung nodule. Patient received morphine with improved pain. ED provider discussed with Dr. Marin Olp, oncology, who recommended medicine admission and who would follow while inpatient.  No obvious patterns in maint c/o abd pain in terms of day to day or daytime variability or assoc excess/ purulent sputum or mucus plugs or hemoptysis or cp or chest tightness, subjective wheeze or overt sinus or hb symptoms.   Sleeping  without nocturnal  or early am exacerbation  of respiratory  c/o's or need for noct saba. Also denies any obvious fluctuation of symptoms with weather or environmental changes or other aggravating or alleviating factors except as outlined above   No unusual exposure hx or h/o childhood pna/ asthma or knowledge of premature birth.  Current Allergies, Complete Past Medical History, Past Surgical History, Family History, and Social History were reviewed in [employer] record.  ROS  The following are not active complaints unless bolded Hoarseness, sore throat, dysphagia, dental problems, itching, sneezing,  nasal congestion or discharge of excess mucus or purulent secretions, ear ache,   fever, chills, sweats, unintended wt loss or wt gain, classically pleuritic or exertional cp,  orthopnea pnd or arm/hand swelling  or leg swelling, presyncope, palpitations, abdominal pain - describes as colicky (30 sec severe then eases up) and generalized (takes hand and circles entire upper and lower abd as to location) , anorexia, nausea, vomiting, diarrhea  or change in  bowel habits or change in bladder habits, change in stools or change in urine, dysuria, hematuria,  rash, arthralgias/back pain, visual complaints, headache, numbness, weakness or ataxia or problems with walking or coordination,  change in mood or  memory.            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 pylori and GERD   Significant Hospital Events     Consults:  Oncology 11/28 PCCM  11/28  GI   11/28   Procedures:     Significant Diagnostic Tests:   CT w contrast 11/27  1. Posterior right upper lobe lung mass which extends to involve the right hilum, consistent with primary lung malignancy. 2. Numerous heterogeneous low-attenuation liver lesions, consistent with metastatic disease. 3. 5 mm noncalcified left lower lobe and left basilar noncalcified lung nodule versus focal scar.  Micro Data:  COVID 19  / flu PCR  11/27 neg   Antimicrobials:      Scheduled Meds: . amLODipine  5 mg Oral Daily  . atorvastatin  20 mg Oral Daily  . gabapentin  600 mg Oral TID  . hydrochlorothiazide  25 mg Oral Daily  . iohexol  500 mL Oral Q1H  . iohexol      . losartan  100 mg Oral Daily  . mirtazapine  45 mg Oral QHS  . nystatin  5 mL Oral QID  . tamsulosin  0.4 mg Oral Daily  . terazosin  2 mg Oral QHS   Continuous Infusions: PRN Meds:.acetaminophen **OR** acetaminophen, ALPRAZolam, HYDROcodone-acetaminophen, morphine injection, ondansetron **OR** ondansetron (ZOFRAN) IV, phenol, polyethylene glycol  Interim history/subjective:  No  Overnight issues. Had EGD and colonoscopy this morning.   Objective   Blood pressure 104/60, pulse 97, temperature 99.1 F (37.3 C), temperature source Axillary, resp. rate 19, height 5\' 7"  (1.702 m), SpO2 97 %.        Intake/Output Summary (Last 24 hours) at 07/14/2020 1137 Last data filed at 07/14/2020 1003 Gross per 24 hour  Intake 2532.98 ml  Output 600 ml  Net 1932.98 ml     Examination: General: no distress HENT mmm Lungs: diminished, no wheezes or crackles Cardiovascular: RRR no extra heart sounds Abdomen: soft, diffusely tender Extremities: warm no cyanosis clubbing or edema  Neuro: normal speech, no focal asymmetry  Resolved Hospital Problem list      Assessment & Plan:    1)  RUL lung mass concerning for primary lung malignancy 2)  Liver metastases  EGD and colon negative for malignancy. Recommend CT guided biopsy of liver lesions for both diagnosis and staging. Concern for primary lung cancer with metastatic disease.  Discussed plan of care with wife and patient, all questions answered. Will follow with you regarding results.  Lenice Llamas, MD Pulmonary and [place] Pager: [place]   CBC: Recent Labs  Lab 07/12/20 1339 07/13/20 0602 07/14/20 0532  WBC 12.4* 9.0 9.2  NEUTROABS 10.5*  --   --   HGB 10.7* 10.1* 10.7*  HCT 33.0* 31.9* 33.5*  MCV 88.9 89.1 89.1  PLT 607* 535* 570*    Basic Metabolic Panel: Recent Labs  Lab 07/12/20 1339 07/13/20 0602 07/14/20 0532  NA 137 139 142  K 3.8 3.7 3.8  CL 102 105 108  CO2 28 24 22   GLUCOSE 176* 99 121*  BUN 12 9 10   CREATININE 1.09 0.94 0.99  CALCIUM 9.2  9.5 9.9   GFR: CrCl cannot be calculated (Unknown ideal weight.). Recent Labs  Lab 07/12/20 1339 07/13/20 0602 07/14/20 0532  WBC 12.4* 9.0 9.2    Liver Function Tests: Recent Labs  Lab 07/12/20 1339  AST 25  ALT 13  ALKPHOS 81  BILITOT 0.8  PROT 7.1  ALBUMIN 2.9*   Recent Labs  Lab 07/12/20 1339  LIPASE 22   No results for input(s): AMMONIA in the last 168 hours.  ABG No results found for: PHART, PCO2ART, PO2ART, HCO3, TCO2, ACIDBASEDEF, O2SAT   Coagulation Profile: Recent Labs  Lab 07/14/20 1048  INR 1.0    Cardiac Enzymes: No results for input(s): CKTOTAL, CKMB, CKMBINDEX, TROPONINI in the last 168 hours.  HbA1C: No results found for: HGBA1C  CBG: No results for input(s): GLUCAP in the last 168 hours.    Past Medical History  He,  has a past medical history of Anemia, Arthritis, Bilateral inguinal hernia (BIH) s/p lap repair 12/29/2012 (12/04/2012), Chronic back pain, Complication of anesthesia, Diabetes mellitus without complication  ([place]), Diverticulosis, GERD (gastroesophageal reflux disease), Goals of care, counseling/discussion (07/13/2020), Hemangioma of left chest wall s/p excision 12/29/2012 (01/17/2013), Hemangioma of left chest wall s/p excision 12/29/2012 (01/17/2013), Hypertension, Knee pain, Metastatic cancer ([place]) (07/13/2020), Non-small cell lung cancer metastatic to liver ([place]) (07/13/2020), Squamous cell carcinoma in situ (SCCIS) (09/18/2019), Squamous cell carcinoma in situ (SCCIS) (10/25/2019), Squamous cell carcinoma in situ (SCCIS) (10/25/2019), Squamous cell carcinoma in situ (SCCIS) (10/25/2019), Squamous cell carcinoma of skin (10/25/2019), and Squamous cell carcinoma of skin (09/18/2019).   Surgical History    Past Surgical History:  Procedure Laterality Date  . CATARACT EXTRACTION W/ INTRAOCULAR LENS  IMPLANT, BILATERAL  1996  . COLONOSCOPY  04/03/01   JJO:ACZYSAYT hemorrhoids otherwise normal  . COLONOSCOPY N/A 11/09/2012   Dr. Gala Romney- normal rectum, scattered left-sided diverticula, the remainder of colonic mucosa appeared normal.  . ESOPHAGOGASTRODUODENOSCOPY  06/25/03   KZS:WFUXNA colored tongue-NO BARRETTs (EROSIVE ESOPHAGITIS)/small HH  . HERNIA REPAIR     ? lower abd  . INSERTION OF MESH Bilateral 12/29/2012   Procedure: INSERTION OF MESH;  Surgeon: Adin Hector, MD;  Location: [facility];  Service: General;  Laterality: Bilateral;  . KNEE SURGERY     left x 2  . LAPAROSCOPIC INGUINAL HERNIA WITH UMBILICAL HERNIA Bilateral 12/29/2012   Procedure: LAPAROSCOPIC INGUINAL HERNIA WITH UMBILICAL HERNIA;  Surgeon: Adin Hector, MD;  Location: [facility];  Service: General;  Laterality: Bilateral;  . MASS EXCISION Left 12/29/2012   Procedure: EXCISION MASS LEFT CHEST WALL;  Surgeon: Adin Hector, MD;  Location: [facility];  Service: General;  Laterality: Left;  . WISDOM TOOTH EXTRACTION       Social History   reports that he quit smoking about 23 years ago. His smoking use included cigarettes. He has a 90.00  pack-year smoking history. He quit smokeless tobacco use about 25 years ago.  His smokeless tobacco use included chew. He reports current alcohol use of about 1.0 standard drink of alcohol per week. He reports that he does not use drugs.   Family History   His family history includes Diabetes in his father; Hypertension in his father; Lung cancer in his mother; Prostate cancer in his father; Stroke in his father.   Allergies Allergies  Allergen Reactions  . Penicillins Rash    Has patient had a PCN reaction causing immediate rash, facial/tongue/throat swelling, SOB or lightheadedness with hypotension: yes Has patient had a PCN reaction causing severe  rash involving mucus membranes or skin necrosis: no Has patient had a PCN reaction that required hospitalization: no Has patient had a PCN reaction occurring within the last 10 years:noIf all of the above answers are "NO", then may proceed with Cephalosporin use.      Home Medications  Prior to Admission medications   Medication Sig Start Date End Date Taking? Authorizing Provider  acetaminophen (TYLENOL) 650 MG CR tablet Take 650 mg by mouth every 8 (eight) hours as needed for pain (arthritis).    Yes [provider]  alprazolam Duanne Moron) 2 MG tablet Take 1 mg by mouth every 3 (three) hours.   Yes [provider]  amLODipine (NORVASC) 5 MG tablet Take 5 mg by mouth daily.    Yes [provider]  aspirin 81 MG tablet Take 81 mg by mouth daily.   Yes [provider]  atorvastatin (LIPITOR) 20 MG tablet Take 20 mg by mouth daily. 10/12/17  Yes [provider]  cholecalciferol (VITAMIN D3) 25 MCG (1000 UNIT) tablet Take 1,000 Units by mouth daily.   Yes [provider]  Coenzyme Q10 (CO Q 10) 100 MG CAPS Take 100 mg by mouth daily.   Yes [provider]  Fluorouracil (TOLAK) 4 % CREA Apply 1 application topically daily.    Yes [provider]  gabapentin (NEURONTIN) 100 MG  capsule Take 100 mg by mouth daily as needed (Nerve pain).  10/25/19  Yes [provider]  gabapentin (NEURONTIN) 600 MG tablet Take 600 mg by mouth 3 (three) times daily.  11/08/19  Yes [provider]  hydrochlorothiazide (HYDRODIURIL) 25 MG tablet Take 25 mg by mouth daily.  08/11/12  Yes [provider]  losartan (COZAAR) 100 MG tablet Take 100 mg by mouth daily.   Yes [provider]  mirtazapine (REMERON) 45 MG tablet Take 45 mg by mouth at bedtime.   Yes [provider]  Multiple Vitamin (MULTIVITAMIN) tablet Take 1 tablet by mouth daily. Centrum Silver   Yes [provider]  pantoprazole (PROTONIX) 40 MG tablet Take 40 mg by mouth 2 (two) times daily.   Yes [provider]  Probiotic Product (ALIGN) 4 MG CAPS Take 4 mg by mouth daily.   Yes [provider]  tamsulosin (FLOMAX) 0.4 MG CAPS capsule Take 0.4 mg by mouth daily.  04/11/13  Yes [provider]  terazosin (HYTRIN) 2 MG capsule Take 2 mg by mouth at bedtime.    Yes [provider]  Turmeric 500 MG CAPS Take 500 mg by mouth daily.    Yes [provider]  Wheat Dextrin (BENEFIBER PO) Take 1 Scoop by mouth daily.    Yes [provider]  zinc gluconate 50 MG tablet Take 50 mg by mouth daily.   Yes [provider]  polyethylene glycol-electrolytes (TRILYTE) 420 g solution Take 4,000 mLs by mouth as directed. Patient not taking: Reported on 07/12/2020 06/24/20   Daneil Dolin, MD

## 2020-07-15 ENCOUNTER — Other Ambulatory Visit: Payer: Self-pay

## 2020-07-15 ENCOUNTER — Inpatient Hospital Stay (HOSPITAL_COMMUNITY): Payer: Medicare HMO

## 2020-07-15 ENCOUNTER — Other Ambulatory Visit (HOSPITAL_COMMUNITY): Payer: Medicare HMO

## 2020-07-15 ENCOUNTER — Telehealth: Payer: Self-pay

## 2020-07-15 ENCOUNTER — Encounter: Payer: Self-pay | Admitting: Internal Medicine

## 2020-07-15 DIAGNOSIS — R52 Pain, unspecified: Secondary | ICD-10-CM | POA: Diagnosis not present

## 2020-07-15 LAB — BASIC METABOLIC PANEL
Anion gap: 12 (ref 5–15)
BUN: 10 mg/dL (ref 8–23)
CO2: 22 mmol/L (ref 22–32)
Calcium: 9.4 mg/dL (ref 8.9–10.3)
Chloride: 104 mmol/L (ref 98–111)
Creatinine, Ser: 1 mg/dL (ref 0.61–1.24)
GFR, Estimated: >60 mL/min (ref >60)
Glucose, Bld: 93 mg/dL (ref 70–99)
Potassium: 3.5 mmol/L (ref 3.5–5.1)
Sodium: 138 mmol/L (ref 135–145)

## 2020-07-15 LAB — CBC
HCT: 31.2 % — ABNORMAL LOW (ref 39.0–52.0)
Hemoglobin: 10 g/dL — ABNORMAL LOW (ref 13.0–17.0)
MCH: 28.4 pg (ref 26.0–34.0)
MCHC: 32.1 g/dL (ref 30.0–36.0)
MCV: 88.6 fL (ref 80.0–100.0)
Platelets: 547 10*3/uL — ABNORMAL HIGH (ref 150–400)
RBC: 3.52 MIL/uL — ABNORMAL LOW (ref 4.22–5.81)
RDW: 13.7 % (ref 11.5–15.5)
WBC: 9.8 10*3/uL (ref 4.0–10.5)
nRBC: 0 % (ref 0.0–0.2)

## 2020-07-15 LAB — SURGICAL PATHOLOGY

## 2020-07-15 MED ORDER — TECHNETIUM TC 99M MEDRONATE IV KIT
20.2000 | PACK | Freq: Once | INTRAVENOUS | Status: AC
  Administered 2020-07-15: 20.2 via INTRAVENOUS

## 2020-07-15 MED ORDER — FENTANYL CITRATE (PF) 100 MCG/2ML IJ SOLN
INTRAMUSCULAR | Status: AC
  Filled 2020-07-15: qty 2

## 2020-07-15 MED ORDER — MIDAZOLAM HCL 2 MG/2ML IJ SOLN
INTRAMUSCULAR | Status: AC
  Filled 2020-07-15: qty 2

## 2020-07-15 MED ORDER — FENTANYL CITRATE (PF) 100 MCG/2ML IJ SOLN
INTRAMUSCULAR | Status: AC | PRN
Start: 2020-07-15 — End: 2020-07-15
  Administered 2020-07-15 (×2): 25 ug via INTRAVENOUS

## 2020-07-15 MED ORDER — GELATIN ABSORBABLE 12-7 MM EX MISC
CUTANEOUS | Status: AC
  Filled 2020-07-15: qty 1

## 2020-07-15 MED ORDER — HYDROMORPHONE HCL 1 MG/ML IJ SOLN: 1.0000 mg | Freq: Once | INTRAMUSCULAR | Status: DC

## 2020-07-15 MED ORDER — LIDOCAINE HCL 1 % IJ SOLN
INTRAMUSCULAR | Status: AC
  Filled 2020-07-15: qty 20

## 2020-07-15 MED ORDER — LORAZEPAM 2 MG/ML IJ SOLN: 1.0000 mg | Freq: Once | INTRAMUSCULAR | Status: DC

## 2020-07-15 MED ORDER — LIDOCAINE HCL (PF) 1 % IJ SOLN
INTRAMUSCULAR | Status: AC | PRN
  Administered 2020-07-15: 10 mL via INTRADERMAL

## 2020-07-15 MED ORDER — MIDAZOLAM HCL 2 MG/2ML IJ SOLN
INTRAMUSCULAR | Status: AC | PRN
  Administered 2020-07-15 (×3): 1 mg via INTRAVENOUS

## 2020-07-15 MED ORDER — GELATIN ABSORBABLE 12-7 MM EX MISC
CUTANEOUS | Status: AC | PRN
  Administered 2020-07-15: 1 via TOPICAL

## 2020-07-15 NOTE — Telephone Encounter (Signed)
Melanie at [facility] called office, she canceled TCS/EGD scheduled for tomorrow. He is inpatient at [facility] and had procedures yesterday.  FYI to Dr. Gala Romney.

## 2020-07-15 NOTE — Progress Notes (Signed)
Mr. Dismuke had the upper and lower endoscopy.  He had esophageal dilation.  Nothing obvious was found.  He did have a CT scan done of the abdomen pelvis.  He has some necrotic adenopathy.  He had the liver lesions.  Pulmonary feels that a liver biopsy is the way to go now.  Hopefully, enough tissue will be obtained so we do our molecular studies.  This is going to be critical.  He still has no appetite.  We will try him on some Marinol to see if this may help a little bit.  He still is iron deficient.  We will go ahead and give him some IV iron.  He has really good peripheral access.  As such, I do not think we have to put in a Port-A-Cath for chemotherapy.  He does have a lot of lower back discomfort.  This appears to have happened when he was on the table for the CT scan.  A bone scan hopefully will be done today.  Once we get the biopsies, I think that he might be able to go home.  I do not expect the biopsy results will be back for another 2 or 3 days.  It is hard to say why he has the discomfort.  It might be from the adenopathy that he has in the abdomen.  I really cannot think of any other test that we have to do right now.  A PET scan really cannot be done as an inpatient.  I suspect he probably will need an MRI of the brain.  Maybe we can get this while he is in the hospital.  I know that he is getting fantastic care from all staff upon [address].  Lattie Haw, MD  Psalm 41:1

## 2020-07-15 NOTE — Procedures (Signed)
Interventional Radiology Procedure Note  Procedure: US guided biopsy of liver lesion, right lobe  Complications: None EBL: None Recommendations: - Bedrest 3 hours.   - Routine wound care - Follow up pathology - Ok to advance diet per primary  Signed,  Corrie Mckusick, DO

## 2020-07-15 NOTE — Progress Notes (Signed)
PROGRESS NOTE    Lee Christensen  BUL:[id number] DOB: [birth year] DOA: 07/12/2020 PCP: Redmond School, MD    Brief Narrative:  69 year old gentleman with history of hypertension, diabetes, chronic back pain, recent H. pylori and diagnosis of GERD presented to the ER with intermittent right upper quadrant abdominal pain, low back pain and weight loss of more than 30 pounds in last 2 to 3 months.  Poor appetite.  Patient was scheduled for EGD and colonoscopy for work-up of weight loss and poor appetite next week.  In the emergency room, afebrile.  Hemodynamically stable.  On room air.  CT scan chest abdomen pelvis showed 7 cm right upper lobe tumor along with numerous metastatic tumor on his liver.  Admitted for symptom control and diagnosis.   Assessment & Plan:   Principal Problem:   Pain management Active Problems:   Abdominal pain   Back pain   GERD (gastroesophageal reflux disease)   Palliative care by specialist   Goals of care, counseling/discussion   Non-small cell lung cancer metastatic to liver [facility])   Liver mass   Lung mass   Abnormal CT of the abdomen   Iron deficiency anemia   Esophageal dysphagia   Schatzki's ring  Suspected bronchogenic cancer with liver metastasis.  Metastatic pain. -Followed by oncology.  Managing with oral opiates today for anticipation of discharge tomorrow. -EGD colonoscopy 11/29 with no obvious malignant lesions.  Remains on PPI. -Bone scan 11/30, no bony lesions found. -Liver biopsy planned today. -MRI brain ordered by Dr. Marin Olp, patient was given 1 mg of Dilaudid and 1 mg IV Ativan but he refused to go to MRI due to claustrophobia.  Asking for sedation.  Will discuss with anesthesia.  Failure to thrive.  Cancer anorexia: -Augment nutrition.  Anxiety/insomnia: Continue chronic benzo therapy.  Continue.  He is on high-dose mirtazapine at night.  Hypertension: Stable on hydrochlorothiazide, amlodipine and losartan.  BPH: On Flomax.   Continue.   DVT prophylaxis: Lovenox.  Hold today for biopsy.   Code Status: Full code Family Communication: Wife at the bedside Disposition Plan: Status is: Inpatient  Remains inpatient appropriate because:Inpatient level of care appropriate due to severity of illness   Dispo: The patient is from: Home              Anticipated d/c is to: Home              Anticipated d/c date is: Anticipate tomorrow.              Patient currently is not medically stable to d/c. Anticipated bone scan.  Anticipated liver biopsy today.   Consultants:   Palliative medicine  Oncology  Gastroenterology  Procedures:   None  Antimicrobials:   None   Subjective: Seen and examined.  Back pain after laying on the CT scan.  No other events.  Appetite remains poor as usual. His main concern is not getting enough sleep at night.  Objective: Vitals:   07/15/20 1411 07/15/20 1435 07/15/20 1440 07/15/20 1500  BP: (!) 146/70 (!) 147/67 139/68 139/80  Pulse: 99 95 (!) 106 82  Resp: 16 17 19 18   Temp:    99.5 F (37.5 C)  TempSrc:    Oral  SpO2: 100% 100% 100% 96%  Height:        Intake/Output Summary (Last 24 hours) at 07/15/2020 1528 Last data filed at 07/15/2020 1445 Gross per 24 hour  Intake 240 ml  Output 801 ml  Net -561 ml  There were no vitals filed for this visit.  Examination:  General exam: Chronically sick looking.  Anxious. Thin and frail.  Not in any distress. Respiratory system: Clear to auscultation. Respiratory effort normal.  No added sounds. Cardiovascular system: S1 & S2 heard, RRR.  Gastrointestinal system: Soft.  Nontender.   Central nervous system: Alert and oriented. No focal neurological deficits. Extremities: Symmetric 5 x 5 power. Skin: No rashes, lesions or ulcers Psychiatry: Judgement and insight appear normal. Mood & affect anxious.    Data Reviewed: I have personally reviewed following labs and imaging studies  CBC: Recent Labs  Lab  07/12/20 1339 07/13/20 0602 07/14/20 0532 07/15/20 0614  WBC 12.4* 9.0 9.2 9.8  NEUTROABS 10.5*  --   --   --   HGB 10.7* 10.1* 10.7* 10.0*  HCT 33.0* 31.9* 33.5* 31.2*  MCV 88.9 89.1 89.1 88.6  PLT 607* 535* 570* 638*   Basic Metabolic Panel: Recent Labs  Lab 07/12/20 1339 07/13/20 0602 07/14/20 0532 07/15/20 0614  NA 137 139 142 138  K 3.8 3.7 3.8 3.5  CL 102 105 108 104  CO2 28 24 22 22   GLUCOSE 176* 99 121* 93  BUN 12 9 10 10   CREATININE 1.09 0.94 0.99 1.00  CALCIUM 9.2 9.5 9.9 9.4   GFR: CrCl cannot be calculated (Unknown ideal weight.). Liver Function Tests: Recent Labs  Lab 07/12/20 1339  AST 25  ALT 13  ALKPHOS 81  BILITOT 0.8  PROT 7.1  ALBUMIN 2.9*   Recent Labs  Lab 07/12/20 1339  LIPASE 22   No results for input(s): AMMONIA in the last 168 hours. Coagulation Profile: Recent Labs  Lab 07/14/20 1048  INR 1.0   Cardiac Enzymes: No results for input(s): CKTOTAL, CKMB, CKMBINDEX, TROPONINI in the last 168 hours. BNP (last 3 results) No results for input(s): PROBNP in the last 8760 hours. HbA1C: No results for input(s): HGBA1C in the last 72 hours. CBG: No results for input(s): GLUCAP in the last 168 hours. Lipid Profile: No results for input(s): CHOL, HDL, LDLCALC, TRIG, CHOLHDL, LDLDIRECT in the last 72 hours. Thyroid Function Tests: No results for input(s): TSH, T4TOTAL, FREET4, T3FREE, THYROIDAB in the last 72 hours. Anemia Panel: Recent Labs    07/13/20 0602  FERRITIN 132  TIBC 236*  IRON 20*   Sepsis Labs: No results for input(s): PROCALCITON, LATICACIDVEN in the last 168 hours.  Recent Results (from the past 240 hour(s))  Resp Panel by RT-PCR (Flu A&B, Covid) Nasopharyngeal Swab     Status: None   Collection Time: 07/12/20  3:26 PM   Specimen: Nasopharyngeal Swab; Nasopharyngeal(NP) swabs in vial transport medium  Result Value Ref Range Status   SARS Coronavirus 2 by RT PCR NEGATIVE NEGATIVE Final    Comment:  (NOTE) SARS-CoV-2 target nucleic acids are NOT DETECTED.  The SARS-CoV-2 RNA is generally detectable in upper respiratory specimens during the acute phase of infection. The lowest concentration of SARS-CoV-2 viral copies this assay can detect is 138 copies/mL. A negative result does not preclude SARS-Cov-2 infection and should not be used as the sole basis for treatment or other patient management decisions. A negative result may occur with  improper specimen collection/handling, submission of specimen other than nasopharyngeal swab, presence of viral mutation(s) within the areas targeted by this assay, and inadequate number of viral copies(<138 copies/mL). A negative result must be combined with clinical observations, patient history, and epidemiological information. The expected result is Negative.  Fact Sheet for Patients:  EntrepreneurPulse.com.au  Fact Sheet for Healthcare Providers:  IncredibleEmployment.be  This test is no t yet approved or cleared by the [place] FDA and  has been authorized for detection and/or diagnosis of SARS-CoV-2 by FDA under an Emergency Use Authorization (EUA). This EUA will remain  in effect (meaning this test can be used) for the duration of the COVID-19 declaration under Section 564(b)(1) of the Act, 21 U.S.C.section 360bbb-3(b)(1), unless the authorization is terminated  or revoked sooner.       Influenza A by PCR NEGATIVE NEGATIVE Final   Influenza B by PCR NEGATIVE NEGATIVE Final    Comment: (NOTE) The Xpert Xpress SARS-CoV-2/FLU/RSV plus assay is intended as an aid in the diagnosis of influenza from Nasopharyngeal swab specimens and should not be used as a sole basis for treatment. Nasal washings and aspirates are unacceptable for Xpert Xpress SARS-CoV-2/FLU/RSV testing.  Fact Sheet for Patients: EntrepreneurPulse.com.au  Fact Sheet for Healthcare  Providers: IncredibleEmployment.be  This test is not yet approved or cleared by the [place] FDA and has been authorized for detection and/or diagnosis of SARS-CoV-2 by FDA under an Emergency Use Authorization (EUA). This EUA will remain in effect (meaning this test can be used) for the duration of the COVID-19 declaration under Section 564(b)(1) of the Act, 21 U.S.C. section 360bbb-3(b)(1), unless the authorization is terminated or revoked.  Performed at [facility], [place] [address] [place], [place] [address]          Radiology Studies: NM Bone Scan Whole Body  Result Date: 07/15/2020 CLINICAL DATA:  Non-small-cell lung cancer. EXAM: NUCLEAR MEDICINE WHOLE BODY BONE SCAN TECHNIQUE: Whole body anterior and posterior images were obtained approximately 3 hours after intravenous injection of radiopharmaceutical. RADIOPHARMACEUTICALS:  20.2 mCi Technetium-46m MDP IV COMPARISON:  CT 07/14/2020. FINDINGS: Bilateral renal function excretion. No evidence of bony metastatic disease. Mild increased activity noted about the left knee most likely degenerative. IMPRESSION: No evidence of metastatic disease. Electronically Signed   By: Marcello Moores  Register   On: 07/15/2020 11:13   CT ABDOMEN PELVIS W CONTRAST  Result Date: 07/14/2020 CLINICAL DATA:  Cancer of unknown primary. EXAM: CT ABDOMEN AND PELVIS WITH CONTRAST TECHNIQUE: Multidetector CT imaging of the abdomen and pelvis was performed using the standard protocol following bolus administration of intravenous contrast. CONTRAST:  178mL OMNIPAQUE IOHEXOL 300 MG/ML  SOLN COMPARISON:  07/12/2020 FINDINGS: Lower chest: Unremarkable. Hepatobiliary: As seen on previous noncontrast CT, innumerable tiny ill-defined hypoattenuating liver lesions are evident, suspicious for metastatic disease. Dominant lesion is in the hepatic dome adjacent to the right hepatic vein measuring 1.8 cm on image 8/series 2. Small area of  low attenuation in the anterior liver, adjacent to the falciform ligament, is in a characteristic location for focal fatty deposition. There is no evidence for gallstones, gallbladder wall thickening, or pericholecystic fluid. No intrahepatic or extrahepatic biliary dilation. Pancreas: No focal mass lesion. No dilatation of the main duct. No intraparenchymal cyst. No peripancreatic edema. Spleen: No splenomegaly. No focal mass lesion. Adrenals/Urinary Tract: No adrenal nodule or mass. Cortical scarring noted in both kidneys 14 mm exophytic cyst noted posterior lower pole right kidney. No evidence for hydroureter. The urinary bladder appears normal for the degree of distention. Stomach/Bowel: Tiny hiatal hernia. Stomach otherwise unremarkable. Duodenum is normally positioned as is the ligament of Treitz. Duodenal diverticulum noted. No small bowel wall thickening. No small bowel dilatation. The terminal ileum is normal. The appendix is normal. No gross colonic mass. No colonic wall thickening. Diverticular changes  are noted in the left colon without evidence of diverticulitis. Vascular/Lymphatic: There is abdominal aortic atherosclerosis without aneurysm. Ill-defined necrotic lymphadenopathy is seen in the hepatoduodenal ligament measuring up to 19 mm short axis. This generates mass-effect on the main portal vein. Portal vein, superior mesenteric vein, and splenic vein are patent. The 14 mm short axis portal caval node seen on 24/2. 15 mm short axis left para-aortic node identified posterior to the left renal vein on 24/2. No pelvic sidewall lymphadenopathy. Reproductive: The prostate gland and seminal vesicles are unremarkable. Other: Trace free fluid in the pelvis. Musculoskeletal: No worrisome lytic or sclerotic osseous abnormality. IMPRESSION: 1. Innumerable tiny ill-defined hypoattenuating liver lesions, suspicious for metastatic disease. Dominant lesion is in the hepatic dome adjacent to the right hepatic vein.  2. Necrotic lymphadenopathy in the hepatoduodenal ligament and retroperitoneal space of the abdomen, also consistent with metastatic involvement. 3. No primary source of metastatic disease identified on the current study. Specifically, no evidence for GI tract pancreatic lesion. 4. Tiny hiatal hernia. 5. Aortic Atherosclerosis (ICD10-I70.0). Electronically Signed   By: Misty Stanley M.D.   On: 07/14/2020 15:25        Scheduled Meds: . amLODipine  5 mg Oral Daily  . atorvastatin  20 mg Oral Daily  . feeding supplement  237 mL Oral BID BM  . gabapentin  600 mg Oral TID  . hydrochlorothiazide  25 mg Oral Daily  .  HYDROmorphone (DILAUDID) injection  1 mg Intravenous Once  . lidocaine      . LORazepam  1 mg Intravenous Once  . losartan  100 mg Oral Daily  . midazolam      . mirtazapine  45 mg Oral QHS  . nystatin  5 mL Oral QID  . pantoprazole  40 mg Oral BID AC  . tamsulosin  0.4 mg Oral Daily  . terazosin  2 mg Oral QHS   Continuous Infusions:   LOS: 2 days    Time spent: 30 minutes     Barb Merino, MD [facility] Pager [phone]

## 2020-07-15 NOTE — Progress Notes (Signed)
Brief Pulmonary Note: Pathology pending on liver biopsy. Suspect primary lung cancer.  Liver biopsy should typically have enough tissue for cytogenetics as long as enough passes were taken in IR. Will see as needed for now, but please don't hesitate to contact us if we can be of further assistance.   Lenice Llamas, MD [facility] [place] Pager: [place]

## 2020-07-16 ENCOUNTER — Ambulatory Visit (HOSPITAL_COMMUNITY): Admit: 2020-07-16 | Payer: Medicare HMO | Admitting: Internal Medicine

## 2020-07-16 ENCOUNTER — Encounter (HOSPITAL_COMMUNITY): Admission: RE | Payer: Self-pay | Source: Home / Self Care

## 2020-07-16 ENCOUNTER — Inpatient Hospital Stay (HOSPITAL_COMMUNITY): Payer: Medicare HMO

## 2020-07-16 ENCOUNTER — Encounter (HOSPITAL_COMMUNITY): Payer: Self-pay

## 2020-07-16 ENCOUNTER — Ambulatory Visit (HOSPITAL_COMMUNITY): Admission: RE | Admit: 2020-07-16 | Payer: Medicare HMO | Source: Home / Self Care | Admitting: Internal Medicine

## 2020-07-16 DIAGNOSIS — R9431 Abnormal electrocardiogram [ECG] [EKG]: Secondary | ICD-10-CM

## 2020-07-16 LAB — ECHOCARDIOGRAM COMPLETE
Area-P 1/2: 4.54 cm2
Height: 67 in
S' Lateral: 2.7 cm

## 2020-07-16 LAB — CBC
HCT: 33.4 % — ABNORMAL LOW (ref 39.0–52.0)
Hemoglobin: 10.7 g/dL — ABNORMAL LOW (ref 13.0–17.0)
MCH: 28.6 pg (ref 26.0–34.0)
MCHC: 32 g/dL (ref 30.0–36.0)
MCV: 89.3 fL (ref 80.0–100.0)
Platelets: 553 10*3/uL — ABNORMAL HIGH (ref 150–400)
RBC: 3.74 MIL/uL — ABNORMAL LOW (ref 4.22–5.81)
RDW: 13.7 % (ref 11.5–15.5)
WBC: 10.6 10*3/uL — ABNORMAL HIGH (ref 4.0–10.5)
nRBC: 0 % (ref 0.0–0.2)

## 2020-07-16 LAB — BASIC METABOLIC PANEL
Anion gap: 14 (ref 5–15)
BUN: 17 mg/dL (ref 8–23)
CO2: 22 mmol/L (ref 22–32)
Calcium: 9.8 mg/dL (ref 8.9–10.3)
Chloride: 105 mmol/L (ref 98–111)
Creatinine, Ser: 1.32 mg/dL — ABNORMAL HIGH (ref 0.61–1.24)
GFR, Estimated: 58 mL/min — ABNORMAL LOW (ref >60)
Glucose, Bld: 78 mg/dL (ref 70–99)
Potassium: 4.1 mmol/L (ref 3.5–5.1)
Sodium: 141 mmol/L (ref 135–145)

## 2020-07-16 LAB — TROPONIN I (HIGH SENSITIVITY)
Troponin I (High Sensitivity): 5 ng/L (ref <18)
Troponin I (High Sensitivity): 3 ng/L (ref <18)

## 2020-07-16 LAB — MAGNESIUM: Magnesium: 1.8 mg/dL (ref 1.7–2.4)

## 2020-07-16 LAB — D-DIMER, QUANTITATIVE: D-Dimer, Quant: 2.83 ug/mL-FEU — ABNORMAL HIGH (ref 0.00–0.50)

## 2020-07-16 LAB — GLUCOSE, CAPILLARY
Glucose-Capillary: 76 mg/dL (ref 70–99)
Glucose-Capillary: 142 mg/dL — ABNORMAL HIGH (ref 70–99)

## 2020-07-16 SURGERY — COLONOSCOPY
Anesthesia: Moderate Sedation

## 2020-07-16 MED ORDER — LACTATED RINGERS IV SOLN: INTRAVENOUS | Status: DC

## 2020-07-16 MED ORDER — SODIUM CHLORIDE 0.9 % IV SOLN
510.0000 mg | Freq: Once | INTRAVENOUS | Status: AC
  Administered 2020-07-16: 510 mg via INTRAVENOUS
  Filled 2020-07-16 (×2): qty 17
  Filled 2020-07-16: qty 510

## 2020-07-16 MED ORDER — DRONABINOL 5 MG PO CAPS
5.0000 mg | ORAL_CAPSULE | Freq: Two times a day (BID) | ORAL | Status: DC
  Administered 2020-07-16 (×2): 5 mg via ORAL
  Filled 2020-07-16 (×2): qty 1

## 2020-07-16 MED ORDER — HYDROCODONE-ACETAMINOPHEN 5-325 MG PO TABS: 1.0000 | ORAL_TABLET | ORAL | 0 refills | Status: AC | PRN

## 2020-07-16 MED ORDER — GABAPENTIN 100 MG PO CAPS
200.0000 mg | ORAL_CAPSULE | Freq: Three times a day (TID) | ORAL | Status: DC
  Administered 2020-07-16 (×2): 200 mg via ORAL
  Filled 2020-07-16 (×2): qty 2

## 2020-07-16 MED ORDER — DRONABINOL 5 MG PO CAPS: 5.0000 mg | ORAL_CAPSULE | Freq: Two times a day (BID) | ORAL | 0 refills | Status: AC

## 2020-07-16 NOTE — Progress Notes (Signed)
Patient given discharge instructions with no immediate questions or concerns. Patient taken downstairs via wheelchair for discharge.

## 2020-07-16 NOTE — Progress Notes (Addendum)
Patient's HR is between 120-125, making a yellow MEWS, protocol was initiated. Patient states he is not having any pain at the moment. NP Randol Kern was notified.

## 2020-07-16 NOTE — Discharge Summary (Signed)
Physician Discharge Summary  Lee Christensen GGY:[id number] DOB: [birth year] DOA: 07/12/2020  PCP: Redmond School, MD  Admit date: 07/12/2020 Discharge date: 07/16/2020  Admitted From: Home Disposition: Home  Recommendations for Outpatient Follow-up:  1. Follow up with PCP in 1-2 weeks 2. Please obtain BMP/CBC in one week 3. Oncology to schedule follow-up with you to discuss about biopsy results and treatment plan.  Home Health: Not applicable Equipment/Devices: Not applicable  Discharge Condition: Stable CODE STATUS: Full code Diet recommendation: Regular diet, nutritious food.  Discharge summary: 69 year old gentleman with history of hypertension, chronic back pain, recent H. pylori and diagnosis of GERD, severe insomnia presented to the emergency room with intermittent right upper quadrant abdominal pain, low back pain, weight loss of more than 30 pounds in 3 months, poor appetite.  He had been treated for H. pylori in the past.  He was a scheduled to have upper and lower GI endoscopy as outpatient, however his symptoms were persistent so he came to ER. In the emergency room, he was hemodynamically stable.  He was on room air.  CT scan of the chest abdomen pelvis was done that demonstrated a 7 cm right upper lobe tumor, numerous metastatic tumor on his liver as well necrotic lymphadenopathy in his abdomen.  He was admitted for diagnosis and symptom control.  Suspected bronchogenic cancer with liver metastasis.  Metastatic pain. -Needed IV and oral opiates for pain control.  Now managed on Norco 5/325 every 4 hours.  He will take laxatives along with this pain medications.  Going home on Norco, prescriptions provided.  Will need to renew her prescriptions. -EGD colonoscopy 11/29 with no obvious malignant lesions.  Remains on PPI. -Bone scan 11/30, no bony lesions found. -Liver biopsy done 11/30, biopsy results are pending. -Patient did not tolerate MRI of the head that was planned.  Not done  yet. -Has poor appetite, some improvement with pain control and PPI.  He was prescribed Marinol for cancer related anorexia.  Anxiety/insomnia: Continue chronic benzo therapy.  Continue.  He is on high-dose mirtazapine at night.  Hypertension: Stable on amlodipine and Terazosin.  He developed acute kidney injury with some dehydration today.  Will advised to hold losartan and hydrochlorothiazide until good appetite and normalization of renal functions.  BPH: On Flomax.  Continue.  Patient had developed tachycardia today, cardiac enzymes were normal.  2D echocardiogram was done that was essentially normal.  Patient was asymptomatic.  Currently heart rate is less than 100. This is probably related to dehydration.  He was hydrated.  Now able to take adequate fluid by mouth. Going home with family with a scheduled follow-up with oncology next week.     Discharge Diagnoses:  Principal Problem:   Pain management Active Problems:   Abdominal pain   Back pain   GERD (gastroesophageal reflux disease)   Palliative care by specialist   Goals of care, counseling/discussion   Non-small cell lung cancer metastatic to liver [facility])   Liver mass   Lung mass   Abnormal CT of the abdomen   Iron deficiency anemia   Esophageal dysphagia   Schatzki's ring    Discharge Instructions  Discharge Instructions    Call MD for:  persistant nausea and vomiting   Complete by: As directed    Call MD for:  severe uncontrolled pain   Complete by: As directed    Diet general   Complete by: As directed    Discharge instructions   Complete by: As directed  Drink plenty of water / fluids  Hold taking your blood pressure medicine hydrochlorothiazide and losartan until good appetite.   Increase activity slowly   Complete by: As directed    No wound care   Complete by: As directed      Allergies as of 07/16/2020      Reactions   Penicillins Rash   Has patient had a PCN reaction causing immediate  rash, facial/tongue/throat swelling, SOB or lightheadedness with hypotension: yes Has patient had a PCN reaction causing severe rash involving mucus membranes or skin necrosis: no Has patient had a PCN reaction that required hospitalization: no Has patient had a PCN reaction occurring within the last 10 years:noIf all of the above answers are "NO", then may proceed with Cephalosporin use.      Medication List    STOP taking these medications   hydrochlorothiazide 25 MG tablet Commonly known as: HYDRODIURIL   losartan 100 MG tablet Commonly known as: COZAAR     TAKE these medications   acetaminophen 650 MG CR tablet Commonly known as: TYLENOL Take 650 mg by mouth every 8 (eight) hours as needed for pain (arthritis).   Align 4 MG Caps Take 4 mg by mouth daily.   alprazolam 2 MG tablet Commonly known as: XANAX Take 1 mg by mouth every 3 (three) hours.   amLODipine 5 MG tablet Commonly known as: NORVASC Take 5 mg by mouth daily.   aspirin 81 MG tablet Take 81 mg by mouth daily.   atorvastatin 20 MG tablet Commonly known as: LIPITOR Take 20 mg by mouth daily.   BENEFIBER PO Take 1 Scoop by mouth daily.   cholecalciferol 25 MCG (1000 UNIT) tablet Commonly known as: VITAMIN D3 Take 1,000 Units by mouth daily.   Co Q 10 100 MG Caps Take 100 mg by mouth daily.   dronabinol 5 MG capsule Commonly known as: MARINOL Take 1 capsule (5 mg total) by mouth 2 (two) times daily before lunch and supper. Start taking on: July 17, 2020   gabapentin 100 MG capsule Commonly known as: NEURONTIN Take 100 mg by mouth daily as needed (Nerve pain).   gabapentin 600 MG tablet Commonly known as: NEURONTIN Take 600 mg by mouth 3 (three) times daily.   HYDROcodone-acetaminophen 5-325 MG tablet Commonly known as: NORCO/VICODIN Take 1 tablet by mouth every 4 (four) hours as needed for up to 5 days for moderate pain.   mirtazapine 45 MG tablet Commonly known as: REMERON Take 45 mg  by mouth at bedtime.   multivitamin tablet Take 1 tablet by mouth daily. Centrum Silver   pantoprazole 40 MG tablet Commonly known as: PROTONIX Take 40 mg by mouth 2 (two) times daily.   polyethylene glycol-electrolytes 420 g solution Commonly known as: TriLyte Take 4,000 mLs by mouth as directed.   tamsulosin 0.4 MG Caps capsule Commonly known as: FLOMAX Take 0.4 mg by mouth daily.   terazosin 2 MG capsule Commonly known as: HYTRIN Take 2 mg by mouth at bedtime.   [place] 4 % Crea Generic drug: Fluorouracil Apply 1 application topically daily.   Turmeric 500 MG Caps Take 500 mg by mouth daily.   zinc gluconate 50 MG tablet Take 50 mg by mouth daily.       Follow-up Information    Redmond School, MD Follow up in 2 week(s).   Specialty: Internal Medicine Contact information: [address] [place] [address] [phone]  Allergies  Allergen Reactions  . Penicillins Rash    Has patient had a PCN reaction causing immediate rash, facial/tongue/throat swelling, SOB or lightheadedness with hypotension: yes Has patient had a PCN reaction causing severe rash involving mucus membranes or skin necrosis: no Has patient had a PCN reaction that required hospitalization: no Has patient had a PCN reaction occurring within the last 10 years:noIf all of the above answers are "NO", then may proceed with Cephalosporin use.     Consultations:  Gastroenterology  Hematology oncology  Palliative medicine   Procedures/Studies: CT ABDOMEN PELVIS WO CONTRAST  Result Date: 07/12/2020 CLINICAL DATA:  Abdominal pain EXAM: CT ABDOMEN AND PELVIS WITHOUT CONTRAST TECHNIQUE: Multidetector CT imaging of the abdomen and pelvis was performed following the standard protocol without IV contrast. COMPARISON:  10/21/2017 FINDINGS: Lower chest: Prominent subpleural fat again noted overlying the lateral aspect of the right lower lobe. Lung bases are clear. Heart size  is normal. Hepatobiliary: Interval development of multiple rounded ill-defined low-density lesions scattered throughout the liver highly suspicious for metastatic disease. Unremarkable appearance of the gallbladder. No hyperdense stone or biliary dilatation. Pancreas: Unremarkable. No pancreatic ductal dilatation or surrounding inflammatory changes. Spleen: Normal in size without focal abnormality. Adrenals/Urinary Tract: Unremarkable adrenal glands. Bilateral kidneys are within normal limits. No renal stone or hydronephrosis. Nonspecific bilateral perinephric stranding is similar to prior. Urinary bladder is within normal limits. Stomach/Bowel: Evaluation of the bowel is somewhat limited in the absence of oral or intravenous contrast. Small hiatal hernia. Stomach otherwise appears within normal limits. There are no dilated loops of small bowel. Scattered colonic diverticulosis. No focal bowel wall thickening or inflammatory changes. A normal appendix is seen adjacent to the tip of the right hepatic lobe (series 4, image 62). Vascular/Lymphatic: Scattered aortoiliac atherosclerotic calcifications without aneurysm. No abdominopelvic lymphadenopathy. Reproductive: Prostate gland is within normal limits. Other: No free fluid. No abdominopelvic fluid collection. No pneumoperitoneum. No abdominal wall hernia. Musculoskeletal: No acute osseous abnormality. No suspicious bone lesion. IMPRESSION: 1. Interval development of multiple rounded ill-defined low-density lesions scattered throughout the liver highly suspicious for metastatic disease. No definite evidence of a primary malignancy within the abdomen or pelvis. 2. Colonic diverticulosis without evidence of acute diverticulitis. 3. Small hiatal hernia. 4. Aortic atherosclerosis. (ICD10-I70.0). Electronically Signed   By: Davina Poke D.O.   On: 07/12/2020 14:33   CT CHEST W CONTRAST  Result Date: 07/12/2020 CLINICAL DATA:  Cancer of unknown primary. EXAM: CT  CHEST WITH CONTRAST TECHNIQUE: Multidetector CT imaging of the chest was performed during intravenous contrast administration. CONTRAST:  61mL OMNIPAQUE IOHEXOL 300 MG/ML  SOLN COMPARISON:  None. FINDINGS: Cardiovascular: No significant vascular findings. Normal heart size. No pericardial effusion. Mediastinum/Nodes: 2.5 cm x 1.5 cm and 2.1 cm x 1.4 cm pretracheal lymph nodes are seen. Thyroid gland, trachea, and esophagus demonstrate no significant findings. Lungs/Pleura: A 7.6 cm x 5.2 cm heterogeneous low-attenuation mass is seen within the posterior aspect of the right upper lobe. This extends to involve the right hilum. A 5 mm noncalcified lung nodule is seen within the posterior aspect of the left lung base (axial CT image 125, CT series number 5). A 5 mm ill-defined noncalcified lung nodule versus focal scar seen within the lateral aspect of the left lower lobe (axial CT image 107, CT series number 5). There is no evidence of a pleural effusion or pneumothorax. A small pleural based area of fat attenuation is seen along the anterolateral aspect of the mid right lung. Upper Abdomen:  Numerous heterogeneous low-attenuation liver lesions are seen scattered throughout the right and left lobes of the liver. The largest measures approximately 2.0 cm x 1.7 cm and is seen near the caudate lobe. There is a small hiatal hernia. Musculoskeletal: No chest wall abnormality. No acute or significant osseous findings. IMPRESSION: 1. Posterior right upper lobe lung mass which extends to involve the right hilum, consistent with primary lung malignancy. 2. Numerous heterogeneous low-attenuation liver lesions, consistent with metastatic disease. 3. 5 mm noncalcified left lower lobe and left basilar noncalcified lung nodule versus focal scar. Electronically Signed   By: Virgina Norfolk M.D.   On: 07/12/2020 17:43   NM Bone Scan Whole Body  Result Date: 07/15/2020 CLINICAL DATA:  Non-small-cell lung cancer. EXAM: NUCLEAR  MEDICINE WHOLE BODY BONE SCAN TECHNIQUE: Whole body anterior and posterior images were obtained approximately 3 hours after intravenous injection of radiopharmaceutical. RADIOPHARMACEUTICALS:  20.2 mCi Technetium-56m MDP IV COMPARISON:  CT 07/14/2020. FINDINGS: Bilateral renal function excretion. No evidence of bony metastatic disease. Mild increased activity noted about the left knee most likely degenerative. IMPRESSION: No evidence of metastatic disease. Electronically Signed   By: Marcello Moores  Register   On: 07/15/2020 11:13   CT ABDOMEN PELVIS W CONTRAST  Result Date: 07/14/2020 CLINICAL DATA:  Cancer of unknown primary. EXAM: CT ABDOMEN AND PELVIS WITH CONTRAST TECHNIQUE: Multidetector CT imaging of the abdomen and pelvis was performed using the standard protocol following bolus administration of intravenous contrast. CONTRAST:  130mL OMNIPAQUE IOHEXOL 300 MG/ML  SOLN COMPARISON:  07/12/2020 FINDINGS: Lower chest: Unremarkable. Hepatobiliary: As seen on previous noncontrast CT, innumerable tiny ill-defined hypoattenuating liver lesions are evident, suspicious for metastatic disease. Dominant lesion is in the hepatic dome adjacent to the right hepatic vein measuring 1.8 cm on image 8/series 2. Small area of low attenuation in the anterior liver, adjacent to the falciform ligament, is in a characteristic location for focal fatty deposition. There is no evidence for gallstones, gallbladder wall thickening, or pericholecystic fluid. No intrahepatic or extrahepatic biliary dilation. Pancreas: No focal mass lesion. No dilatation of the main duct. No intraparenchymal cyst. No peripancreatic edema. Spleen: No splenomegaly. No focal mass lesion. Adrenals/Urinary Tract: No adrenal nodule or mass. Cortical scarring noted in both kidneys 14 mm exophytic cyst noted posterior lower pole right kidney. No evidence for hydroureter. The urinary bladder appears normal for the degree of distention. Stomach/Bowel: Tiny hiatal  hernia. Stomach otherwise unremarkable. Duodenum is normally positioned as is the ligament of Treitz. Duodenal diverticulum noted. No small bowel wall thickening. No small bowel dilatation. The terminal ileum is normal. The appendix is normal. No gross colonic mass. No colonic wall thickening. Diverticular changes are noted in the left colon without evidence of diverticulitis. Vascular/Lymphatic: There is abdominal aortic atherosclerosis without aneurysm. Ill-defined necrotic lymphadenopathy is seen in the hepatoduodenal ligament measuring up to 19 mm short axis. This generates mass-effect on the main portal vein. Portal vein, superior mesenteric vein, and splenic vein are patent. The 14 mm short axis portal caval node seen on 24/2. 15 mm short axis left para-aortic node identified posterior to the left renal vein on 24/2. No pelvic sidewall lymphadenopathy. Reproductive: The prostate gland and seminal vesicles are unremarkable. Other: Trace free fluid in the pelvis. Musculoskeletal: No worrisome lytic or sclerotic osseous abnormality. IMPRESSION: 1. Innumerable tiny ill-defined hypoattenuating liver lesions, suspicious for metastatic disease. Dominant lesion is in the hepatic dome adjacent to the right hepatic vein. 2. Necrotic lymphadenopathy in the hepatoduodenal ligament and retroperitoneal space of the  abdomen, also consistent with metastatic involvement. 3. No primary source of metastatic disease identified on the current study. Specifically, no evidence for GI tract pancreatic lesion. 4. Tiny hiatal hernia. 5. Aortic Atherosclerosis (ICD10-I70.0). Electronically Signed   By: Misty Stanley M.D.   On: 07/14/2020 15:25   US BIOPSY (LIVER)  Result Date: 07/15/2020 INDICATION: 69 year old male with a history of multiple liver lesions, referred for biopsy EXAM: ULTRASOUND-GUIDED BIOPSY RIGHT LIVER MASS MEDICATIONS: None. ANESTHESIA/SEDATION: Moderate (conscious) sedation was employed during this procedure. A  total of Versed 3.0 mg and Fentanyl 50 mcg was administered intravenously. Moderate Sedation Time: 11 minutes. The patient's level of consciousness and vital signs were monitored continuously by radiology nursing throughout the procedure under my direct supervision. FLUOROSCOPY TIME:  None COMPLICATIONS: None PROCEDURE: Informed written consent was obtained from the patient after a thorough discussion of the procedural risks, benefits and alternatives. All questions were addressed. Maximal Sterile Barrier Technique was utilized including caps, mask, sterile gowns, sterile gloves, sterile drape, hand hygiene and skin antiseptic. A timeout was performed prior to the initiation of the procedure. Ultrasound survey of the right liver lobe performed with images stored and sent to PACs. The right lower thorax/right upper abdomen was prepped with chlorhexidine in a sterile fashion, and a sterile drape was applied covering the operative field. A sterile gown and sterile gloves were used for the procedure. Local anesthesia was provided with 1% Lidocaine. The patient was prepped and draped sterilely and the skin and subcutaneous tissues were generously infiltrated with 1% lidocaine. A 17 gauge introducer needle was then advanced under ultrasound guidance in an intercostal location into the right liver lobe, targeting mass in the right liver. The stylet was removed, and multiple separate 18 gauge core biopsy were retrieved. Samples were placed into formalin for transportation to the lab. Gel-Foam pledgets were then infused with a small amount of saline for assistance with hemostasis. The needle was removed, and a final ultrasound image was performed. The patient tolerated the procedure well and remained hemodynamically stable throughout. No complications were encountered and no significant blood loss was encounter. IMPRESSION: Status post ultrasound-guided biopsy right liver mass. Signed, Dulcy Fanny. Dellia Nims, RPVI [facility] Radiology Electronically Signed   By: Corrie Mckusick D.O.   On: 07/15/2020 17:22   DG Chest Portable 1 View  Result Date: 07/12/2020 CLINICAL DATA:  Cough EXAM: PORTABLE CHEST 1 VIEW COMPARISON:  Chest x-ray 01/02/2018 FINDINGS: The heart size and mediastinal contours are within normal limits. Interval development of a right upper lobe airspace opacity. Suggestion of thickened right paratracheal stripe. No pulmonary edema. No pleural effusion. No pneumothorax. No acute osseous abnormality. IMPRESSION: Interval development of right upper lobe airspace opacity and suggestion of thickened right paratracheal stripe. Finding could represent infection/inflammation with underlying malignancy not excluded. Recommend CT chest with intravenous contrast for further evaluation. Electronically Signed   By: Iven Finn M.D.   On: 07/12/2020 15:29   ECHOCARDIOGRAM COMPLETE  Result Date: 07/16/2020    ECHOCARDIOGRAM REPORT   Patient Name:   Lee Christensen  Date of Exam: 07/16/2020 Medical Rec #:  [id number]  Height:       67.0 in Accession #:    [id number] Weight:       143.8 lb Date of Birth:  [birth year]   BSA:          1.758 m Patient Age:    36 years   BP:  137/72 mmHg Patient Gender: M          HR:           101 bpm. Exam Location:  Inpatient Procedure: 2D Echo, 3D Echo, Strain Analysis, Color Doppler and Cardiac Doppler Indications:    Abnormal ECG 794.31 / R94.31  History:        Patient has no prior history of Echocardiogram examinations.                 Risk Factors:Diabetes and Hypertension. Liver mass, Lung mass,                 GERD, non-small cell lung cancer metastatic liver.  Sonographer:    Darlina Sicilian RDCS Referring Phys: [id number] [place]  1. Left ventricular ejection fraction, by estimation, is 65 to 70%. The left ventricle has normal function. The left ventricle has no regional wall motion abnormalities. Left ventricular diastolic  parameters are indeterminate.  2. There is moderate enlargement in the mid wall of the right ventricle. Right ventricular systolic function is normal. The right ventricular size is otherwise normal. There is normal pulmonary artery systolic pressure.  3. The mitral valve is grossly normal. No evidence of mitral valve regurgitation.  4. The aortic valve is grossly normal. Aortic valve regurgitation is not visualized. No aortic stenosis is present.  5. The inferior vena cava is normal in size with greater than 50% respiratory variability, suggesting right atrial pressure of 3 mmHg. Comparison(s): No prior Echocardiogram. Conclusion(s)/Recommendation(s): If there is no history of lung disease or NSCLC, consider further evaluation of right ventricle (CMR) if clinically indiicated. FINDINGS  Left Ventricle: LV Foreshortend through study. Left ventricular ejection fraction, by estimation, is 65 to 70%. The left ventricle has normal function. The left ventricle has no regional wall motion abnormalities. Global longitudinal strain performed but not reported based on interpreter judgement due to suboptimal tracking. The left ventricular internal cavity size was small. There is no left ventricular hypertrophy. Left ventricular diastolic parameters are indeterminate. Right Ventricle: There is moderate enlargement in the mid wall of the right ventricle. The right ventricular size is normal. No increase in right ventricular wall thickness. Right ventricular systolic function is normal. There is normal pulmonary artery systolic pressure. The tricuspid regurgitant velocity is 2.48 m/s, and with an assumed right atrial pressure of 3 mmHg, the estimated right ventricular systolic pressure is 72.0 mmHg. Left Atrium: Left atrial size was normal in size. Right Atrium: Right atrial size was normal in size. Pericardium: There is no evidence of pericardial effusion. Mitral Valve: The mitral valve is grossly normal. No evidence of mitral  valve regurgitation. Tricuspid Valve: The tricuspid valve is grossly normal. Tricuspid valve regurgitation is not demonstrated. Aortic Valve: The aortic valve is grossly normal. Aortic valve regurgitation is not visualized. No aortic stenosis is present. Pulmonic Valve: The pulmonic valve was grossly normal. Pulmonic valve regurgitation is trivial. Aorta: The aortic root is normal in size and structure. Venous: The pulmonary veins were not well visualized. The inferior vena cava is normal in size with greater than 50% respiratory variability, suggesting right atrial pressure of 3 mmHg. IAS/Shunts: The atrial septum is grossly normal.  LEFT VENTRICLE PLAX 2D LVIDd:         3.90 cm  Diastology LVIDs:         2.70 cm  LV e' medial:    8.05 cm/s LV PW:         0.90 cm  LV E/e' medial:  9.0 LV IVS:        0.90 cm  LV e' lateral:   8.38 cm/s LVOT diam:     2.20 cm  LV E/e' lateral: 8.6 LV SV:         42 LV SV Index:   24 LVOT Area:     3.80 cm  RIGHT VENTRICLE RV S prime:     14.50 cm/s TAPSE (M-mode): 1.7 cm LEFT ATRIUM             Index       RIGHT ATRIUM          Index LA diam:        2.30 cm 1.31 cm/m  RA Area:     7.11 cm LA Vol (A2C):   25.5 ml 14.51 ml/m RA Volume:   9.22 ml  5.25 ml/m LA Vol (A4C):   18.9 ml 10.75 ml/m LA Biplane Vol: 22.4 ml 12.74 ml/m  AORTIC VALVE LVOT Vmax:   92.40 cm/s LVOT Vmean:  56.300 cm/s LVOT VTI:    0.110 m  AORTA Ao Root diam: 3.20 cm MITRAL VALVE               TRICUSPID VALVE MV Area (PHT): 4.54 cm    TR Peak grad:   24.6 mmHg MV Decel Time: 167 msec    TR Vmax:        248.00 cm/s MV E velocity: 72.30 cm/s MV A velocity: 90.20 cm/s  SHUNTS MV E/A ratio:  0.80        Systemic VTI:  0.11 m                            Systemic Diam: 2.20 cm Rudean Haskell MD Electronically signed by Rudean Haskell MD Signature Date/Time: 07/16/2020/12:23:17 PM    Final     (Echo, Carotid, EGD, Colonoscopy, ERCP)    Subjective: Patient seen and examined.  Overnight events noted.   Patient himself has no complaints.  He feels better tonight.  Slept well.  His appetite slightly improving. He was noted to have sinus tachycardia on routine vital check.  EKG with no acute findings.  Echocardiogram normal.  Troponins normal.   Discharge Exam: Vitals:   07/16/20 1138 07/16/20 1139  BP: 134/64   Pulse: (!) 111 (!) 109  Resp: 16   Temp: 99.1 F (37.3 C)   SpO2: 94% 94%   Vitals:   07/16/20 0755 07/16/20 0959 07/16/20 1138 07/16/20 1139  BP: (!) 144/77 137/72 134/64   Pulse: (!) 126 (!) 101 (!) 111 (!) 109  Resp: 16 16 16    Temp: 98.8 F (37.1 C) 99.5 F (37.5 C) 99.1 F (37.3 C)   TempSrc: Oral Oral Oral   SpO2: 95% 95% 94% 94%  Height:        General: Pt is alert, awake, not in acute distress Cardiovascular: RRR, S1/S2 +, no rubs, no gallops Respiratory: CTA bilaterally, no wheezing, no rhonchi Abdominal: Soft, NT, ND, bowel sounds + Extremities: no edema, no cyanosis    The results of significant diagnostics from this hospitalization (including imaging, microbiology, ancillary and laboratory) are listed below for reference.     Microbiology: Recent Results (from the past 240 hour(s))  Resp Panel by RT-PCR (Flu A&B, Covid) Nasopharyngeal Swab     Status: None   Collection Time: 07/12/20  3:26 PM   Specimen: Nasopharyngeal Swab; Nasopharyngeal(NP) swabs in vial transport medium  Result Value  Ref Range Status   SARS Coronavirus 2 by RT PCR NEGATIVE NEGATIVE Final    Comment: (NOTE) SARS-CoV-2 target nucleic acids are NOT DETECTED.  The SARS-CoV-2 RNA is generally detectable in upper respiratory specimens during the acute phase of infection. The lowest concentration of SARS-CoV-2 viral copies this assay can detect is 138 copies/mL. A negative result does not preclude SARS-Cov-2 infection and should not be used as the sole basis for treatment or other patient management decisions. A negative result may occur with  improper specimen  collection/handling, submission of specimen other than nasopharyngeal swab, presence of viral mutation(s) within the areas targeted by this assay, and inadequate number of viral copies(<138 copies/mL). A negative result must be combined with clinical observations, patient history, and epidemiological information. The expected result is Negative.  Fact Sheet for Patients:  EntrepreneurPulse.com.au  Fact Sheet for Healthcare Providers:  IncredibleEmployment.be  This test is no t yet approved or cleared by the [place] FDA and  has been authorized for detection and/or diagnosis of SARS-CoV-2 by FDA under an Emergency Use Authorization (EUA). This EUA will remain  in effect (meaning this test can be used) for the duration of the COVID-19 declaration under Section 564(b)(1) of the Act, 21 U.S.C.section 360bbb-3(b)(1), unless the authorization is terminated  or revoked sooner.       Influenza A by PCR NEGATIVE NEGATIVE Final   Influenza B by PCR NEGATIVE NEGATIVE Final    Comment: (NOTE) The Xpert Xpress SARS-CoV-2/FLU/RSV plus assay is intended as an aid in the diagnosis of influenza from Nasopharyngeal swab specimens and should not be used as a sole basis for treatment. Nasal washings and aspirates are unacceptable for Xpert Xpress SARS-CoV-2/FLU/RSV testing.  Fact Sheet for Patients: EntrepreneurPulse.com.au  Fact Sheet for Healthcare Providers: IncredibleEmployment.be  This test is not yet approved or cleared by the [place] FDA and has been authorized for detection and/or diagnosis of SARS-CoV-2 by FDA under an Emergency Use Authorization (EUA). This EUA will remain in effect (meaning this test can be used) for the duration of the COVID-19 declaration under Section 564(b)(1) of the Act, 21 U.S.C. section 360bbb-3(b)(1), unless the authorization is terminated or revoked.  Performed at [facility], [place] [address], [place], [place] [address]      Labs: BNP (last 3 results) No results for input(s): BNP in the last 8760 hours. Basic Metabolic Panel: Recent Labs  Lab 07/12/20 1339 07/13/20 0602 07/14/20 0532 07/15/20 0614 07/16/20 0550  NA 137 139 142 138 141  K 3.8 3.7 3.8 3.5 4.1  CL 102 105 108 104 105  CO2 28 24 22 22 22   GLUCOSE 176* 99 121* 93 78  BUN 12 9 10 10 17   CREATININE 1.09 0.94 0.99 1.00 1.32*  CALCIUM 9.2 9.5 9.9 9.4 9.8  MG  --   --   --   --  1.8   Liver Function Tests: Recent Labs  Lab 07/12/20 1339  AST 25  ALT 13  ALKPHOS 81  BILITOT 0.8  PROT 7.1  ALBUMIN 2.9*   Recent Labs  Lab 07/12/20 1339  LIPASE 22   No results for input(s): AMMONIA in the last 168 hours. CBC: Recent Labs  Lab 07/12/20 1339 07/13/20 0602 07/14/20 0532 07/15/20 0614 07/16/20 0550  WBC 12.4* 9.0 9.2 9.8 10.6*  NEUTROABS 10.5*  --   --   --   --   HGB 10.7* 10.1* 10.7* 10.0* 10.7*  HCT 33.0* 31.9* 33.5* 31.2* 33.4*  MCV 88.9 89.1 89.1 88.6 89.3  PLT 607* 535* 570* 547* 553*   Cardiac Enzymes: No results for input(s): CKTOTAL, CKMB, CKMBINDEX, TROPONINI in the last 168 hours. BNP: Invalid input(s): POCBNP CBG: Recent Labs  Lab 07/16/20 0645 07/16/20 1136  GLUCAP 76 142*   D-Dimer Recent Labs    07/16/20 0728  DDIMER 2.83*   Hgb A1c No results for input(s): HGBA1C in the last 72 hours. Lipid Profile No results for input(s): CHOL, HDL, LDLCALC, TRIG, CHOLHDL, LDLDIRECT in the last 72 hours. Thyroid function studies No results for input(s): TSH, T4TOTAL, T3FREE, THYROIDAB in the last 72 hours.  Invalid input(s): FREET3 Anemia work up No results for input(s): VITAMINB12, FOLATE, FERRITIN, TIBC, IRON, RETICCTPCT in the last 72 hours. Urinalysis    Component Value Date/Time   COLORURINE YELLOW 07/12/2020 1323   APPEARANCEUR CLEAR 07/12/2020 1323   LABSPEC 1.005 07/12/2020 1323   PHURINE 7.0 07/12/2020 1323    GLUCOSEU NEGATIVE 07/12/2020 1323   HGBUR NEGATIVE 07/12/2020 1323   BILIRUBINUR NEGATIVE 07/12/2020 1323   KETONESUR NEGATIVE 07/12/2020 1323   PROTEINUR NEGATIVE 07/12/2020 1323   NITRITE NEGATIVE 07/12/2020 1323   LEUKOCYTESUR NEGATIVE 07/12/2020 1323   Sepsis Labs Invalid input(s): PROCALCITONIN,  WBC,  LACTICIDVEN Microbiology Recent Results (from the past 240 hour(s))  Resp Panel by RT-PCR (Flu A&B, Covid) Nasopharyngeal Swab     Status: None   Collection Time: 07/12/20  3:26 PM   Specimen: Nasopharyngeal Swab; Nasopharyngeal(NP) swabs in vial transport medium  Result Value Ref Range Status   SARS Coronavirus 2 by RT PCR NEGATIVE NEGATIVE Final    Comment: (NOTE) SARS-CoV-2 target nucleic acids are NOT DETECTED.  The SARS-CoV-2 RNA is generally detectable in upper respiratory specimens during the acute phase of infection. The lowest concentration of SARS-CoV-2 viral copies this assay can detect is 138 copies/mL. A negative result does not preclude SARS-Cov-2 infection and should not be used as the sole basis for treatment or other patient management decisions. A negative result may occur with  improper specimen collection/handling, submission of specimen other than nasopharyngeal swab, presence of viral mutation(s) within the areas targeted by this assay, and inadequate number of viral copies(<138 copies/mL). A negative result must be combined with clinical observations, patient history, and epidemiological information. The expected result is Negative.  Fact Sheet for Patients:  EntrepreneurPulse.com.au  Fact Sheet for Healthcare Providers:  IncredibleEmployment.be  This test is no t yet approved or cleared by the [place] FDA and  has been authorized for detection and/or diagnosis of SARS-CoV-2 by FDA under an Emergency Use Authorization (EUA). This EUA will remain  in effect (meaning this test can be used) for the duration of  the COVID-19 declaration under Section 564(b)(1) of the Act, 21 U.S.C.section 360bbb-3(b)(1), unless the authorization is terminated  or revoked sooner.       Influenza A by PCR NEGATIVE NEGATIVE Final   Influenza B by PCR NEGATIVE NEGATIVE Final    Comment: (NOTE) The Xpert Xpress SARS-CoV-2/FLU/RSV plus assay is intended as an aid in the diagnosis of influenza from Nasopharyngeal swab specimens and should not be used as a sole basis for treatment. Nasal washings and aspirates are unacceptable for Xpert Xpress SARS-CoV-2/FLU/RSV testing.  Fact Sheet for Patients: EntrepreneurPulse.com.au  Fact Sheet for Healthcare Providers: IncredibleEmployment.be  This test is not yet approved or cleared by the [place] FDA and has been authorized for detection and/or diagnosis of SARS-CoV-2 by FDA under an Emergency Use Authorization (EUA). This EUA will  remain in effect (meaning this test can be used) for the duration of the COVID-19 declaration under Section 564(b)(1) of the Act, 21 U.S.C. section 360bbb-3(b)(1), unless the authorization is terminated or revoked.  Performed at [facility], [place] [address] [place], [place] [address]      Time coordinating discharge:  35 minutes  SIGNED:   Barb Merino, MD  [facility] 07/16/2020, 4:40 PM

## 2020-07-16 NOTE — Progress Notes (Signed)
He had his liver biopsy yesterday.  I would not think that the results will be back for another couple days.  His bone scan came back normal.  There is no evidence of bony metastatic disease.  He is tachycardic this morning.  I looked at the EKG.  It looks like sinus tachycardia.  I would think this is somewhat reactive.  He still not eating much.  We will start the Marinol today on him.  I will give him some iron today also.  His white cell count is 10.6.  Hemoglobin 10.7.  His white cell count 553,000.  His BUN is 17 creatinine 1.32.  There is no bleeding.  He has had no cough.  He has had no leg swelling.  Biopsies from the upper endoscopy show reflux changes.  There is no evidence of malignancy.  If he does go home today, we will certainly follow him up as an outpatient.  I do not think he needs a Port-A-Cath as he has pretty good peripheral IV access.  I am sure that this is going be bronchogenic carcinoma with adenocarcinoma most likely given the elevated CEA level.  Once we have the biopsy results, we will have to send this off for molecular markers.  I appreciate the outstanding care that he is getting from all the staff on 3 E.  Lattie Haw, MD  [facility] 2:3

## 2020-07-16 NOTE — Progress Notes (Signed)
Echocardiogram 2D Echocardiogram with 3D has been performed.  Lee Christensen M 07/16/2020, 10:03 AM

## 2020-07-16 NOTE — Progress Notes (Signed)
Report given to Dayshia, RN

## 2020-07-16 NOTE — Progress Notes (Signed)
   07/16/20 0805  Assess: MEWS Score  Level of Consciousness Alert  O2 Device Room Air  Assess: MEWS Score  MEWS Temp 0  MEWS Systolic 0  MEWS Pulse 2  MEWS RR 0  MEWS LOC 0  MEWS Score 2  MEWS Score Color Yellow  Assess: if the MEWS score is Yellow or Red  Were vital signs taken at a resting state? Yes  Focused Assessment No change from prior assessment (first assessment of pt since he arrived to the unit)  Early Detection of Sepsis Score *See Row Information* Low  MEWS guidelines implemented *See Row Information* Yes  Treat  MEWS Interventions Other (Comment) (Patient connected to telemetry)  Pain Scale 0-10  Pain Score 0  Take Vital Signs  Increase Vital Sign Frequency  Yellow: Q 2hr X 2 then Q 4hr X 2, if remains yellow, continue Q 4hrs  Escalate  MEWS: Escalate Yellow: discuss with charge nurse/RN and consider discussing with provider and RRT  Notify: Charge Nurse/RN  Name of Charge Nurse/RN Notified Kim, RN  Date Charge Nurse/RN Notified 07/16/20  Time Charge Nurse/RN Notified 1052  Document  Patient Outcome Transferred/level of care increased (pt transferred to this unit and put on telemetry)  Progress note created (see row info) Yes   Patient transferred from the 3rd floor to the 6th floor for telemetry monitoring needs due to tachycardia. The patient is in the room stable and telemetry has been applied. Will continue to monitor during shift.

## 2020-07-16 NOTE — Care Management Important Message (Signed)
Important Message  Patient Details IM Letter given to the Patient. Name: Agnes Probert MRN: [id number] Date of Birth: [birth year]   Medicare Important Message Given:  Yes     Kerin Salen 07/16/2020, 9:57 AM

## 2020-07-16 NOTE — Progress Notes (Signed)
MD Ghimire paged with EKG results.

## 2020-07-16 NOTE — Progress Notes (Signed)
Cross Cover New onset tachycarda this am not brought on by pain. BP stable  Review of labs shows evidence of AKI with creatinine now elevated 1.32, previous 1.00 - low volume IV fluids started, monitor renal function and continue renal protection strategies  Gabapentin dose decreased to 200 ARB put on hold HCTZ discontinued   EKG pending Initiate cardiac monitoring White count mild elevation this am - likely reactive with liver biopsy procedure yesterday Mag pending ddimer ordered

## 2020-07-17 LAB — SURGICAL PATHOLOGY

## 2020-07-18 ENCOUNTER — Encounter: Payer: Self-pay | Admitting: *Deleted

## 2020-07-18 NOTE — Progress Notes (Signed)
Per Dr Antonieta Pert request, oncotype MAP testing sent on specimen  [id number] DOS 07/15/2020  Dr Marin Olp would like to see patient on 07/22/2020.  Appointment made and attempted to make contact several times. Each time the line was picked up, and immediately hung up on or disconnected. I was able to reach voice mail one time in which I left a message requesting a call back. Also attempted to call the wife's contact number unsuccessfully.   Will continue to attempt contact.  Oncology Nurse Navigator Documentation  Oncology Nurse Navigator Flowsheets 07/18/2020  Navigator Follow Up Date: 07/22/2020  Navigator Follow Up Reason: New Patient Appointment  Navigator Location [facility]  Navigator Encounter Type Introductory Phone Call;Molecular Studies  Patient Visit Type MedOnc  Treatment Phase Pre-Tx/Tx Discussion  Barriers/Navigation Needs Coordination of Care;Education  Education Other  Interventions Coordination of Care;Education;Other  Acuity Level 2-Minimal Needs (1-2 Barriers Identified)  Coordination of Care Appts  Education Method Verbal  Time Spent with Patient 90

## 2020-07-21 ENCOUNTER — Encounter: Payer: Self-pay | Admitting: *Deleted

## 2020-07-21 ENCOUNTER — Other Ambulatory Visit: Payer: Self-pay | Admitting: *Deleted

## 2020-07-21 DIAGNOSIS — C787 Secondary malignant neoplasm of liver and intrahepatic bile duct: Secondary | ICD-10-CM

## 2020-07-21 DIAGNOSIS — C3490 Malignant neoplasm of unspecified part of unspecified bronchus or lung: Secondary | ICD-10-CM

## 2020-07-21 DIAGNOSIS — Z7189 Other specified counseling: Secondary | ICD-10-CM

## 2020-07-21 NOTE — Progress Notes (Signed)
Reached out to [employer] to introduce myself as the office RN Navigator and explain our new patient process. Reviewed the reason for their referral and scheduled their new patient appointment along with labs. Provided address and directions to the office including call back phone number. Reviewed with patient any concerns they may have or any possible barriers to attending their appointment.   Informed patient about my role as a navigator and that I will meet with them prior to their New Patient appointment and more fully discuss what services I can provide. At this time patient has no further questions or needs.   Oncology Nurse Navigator Documentation  Oncology Nurse Navigator Flowsheets 07/21/2020  Navigator Follow Up Date: 07/22/2020  Navigator Follow Up Reason: New Patient Appointment  Navigator Location [facility]  Navigator Encounter Type Introductory Phone Call  Patient Visit Type MedOnc  Treatment Phase Pre-Tx/Tx Discussion  Barriers/Navigation Needs Coordination of Care;Education  Education Other  Interventions Coordination of Care;Education;Other  Acuity Level 2-Minimal Needs (1-2 Barriers Identified)  Coordination of Care Appts  Education Method Verbal  Support Groups/Services Friends and Family  Time Spent with Patient 15

## 2020-07-22 ENCOUNTER — Inpatient Hospital Stay: Payer: Medicare HMO | Attending: Hematology & Oncology

## 2020-07-22 ENCOUNTER — Inpatient Hospital Stay: Payer: Medicare HMO

## 2020-07-22 ENCOUNTER — Other Ambulatory Visit: Payer: Self-pay

## 2020-07-22 ENCOUNTER — Encounter: Payer: Self-pay | Admitting: *Deleted

## 2020-07-22 ENCOUNTER — Inpatient Hospital Stay (HOSPITAL_BASED_OUTPATIENT_CLINIC_OR_DEPARTMENT_OTHER): Payer: Medicare HMO | Admitting: Hematology & Oncology

## 2020-07-22 ENCOUNTER — Encounter: Payer: Self-pay | Admitting: Hematology & Oncology

## 2020-07-22 VITALS — BP 128/74 | HR 100 | Temp 99.4°F | Resp 18 | Wt 134.0 lb

## 2020-07-22 DIAGNOSIS — C787 Secondary malignant neoplasm of liver and intrahepatic bile duct: Secondary | ICD-10-CM | POA: Diagnosis not present

## 2020-07-22 DIAGNOSIS — C342 Malignant neoplasm of middle lobe, bronchus or lung: Secondary | ICD-10-CM | POA: Diagnosis not present

## 2020-07-22 DIAGNOSIS — C770 Secondary and unspecified malignant neoplasm of lymph nodes of head, face and neck: Secondary | ICD-10-CM

## 2020-07-22 DIAGNOSIS — R634 Abnormal weight loss: Secondary | ICD-10-CM | POA: Diagnosis not present

## 2020-07-22 DIAGNOSIS — R109 Unspecified abdominal pain: Secondary | ICD-10-CM | POA: Diagnosis not present

## 2020-07-22 DIAGNOSIS — C3490 Malignant neoplasm of unspecified part of unspecified bronchus or lung: Secondary | ICD-10-CM

## 2020-07-22 DIAGNOSIS — R630 Anorexia: Secondary | ICD-10-CM | POA: Insufficient documentation

## 2020-07-22 LAB — CMP (CANCER CENTER ONLY)
ALT: 51 U/L — ABNORMAL HIGH (ref 0–44)
AST: 47 U/L — ABNORMAL HIGH (ref 15–41)
Albumin: 3.4 g/dL — ABNORMAL LOW (ref 3.5–5.0)
Alkaline Phosphatase: 170 U/L — ABNORMAL HIGH (ref 38–126)
Anion gap: 9 (ref 5–15)
BUN: 13 mg/dL (ref 8–23)
CO2: 31 mmol/L (ref 22–32)
Calcium: 10.9 mg/dL — ABNORMAL HIGH (ref 8.9–10.3)
Chloride: 98 mmol/L (ref 98–111)
Creatinine: 1.02 mg/dL (ref 0.61–1.24)
GFR, Estimated: >60 mL/min (ref >60)
Glucose, Bld: 119 mg/dL — ABNORMAL HIGH (ref 70–99)
Potassium: 4.4 mmol/L (ref 3.5–5.1)
Sodium: 138 mmol/L (ref 135–145)
Total Bilirubin: 0.4 mg/dL (ref 0.3–1.2)
Total Protein: 7.4 g/dL (ref 6.5–8.1)

## 2020-07-22 LAB — CBC WITH DIFFERENTIAL (CANCER CENTER ONLY)
Abs Immature Granulocytes: 0.08 10*3/uL — ABNORMAL HIGH (ref 0.00–0.07)
Basophils Absolute: 0.1 10*3/uL (ref 0.0–0.1)
Basophils Relative: 0 %
Eosinophils Absolute: 0.2 10*3/uL (ref 0.0–0.5)
Eosinophils Relative: 1 %
HCT: 33.9 % — ABNORMAL LOW (ref 39.0–52.0)
Hemoglobin: 10.8 g/dL — ABNORMAL LOW (ref 13.0–17.0)
Immature Granulocytes: 1 %
Lymphocytes Relative: 10 %
Lymphs Abs: 1.4 10*3/uL (ref 0.7–4.0)
MCH: 28.2 pg (ref 26.0–34.0)
MCHC: 31.9 g/dL (ref 30.0–36.0)
MCV: 88.5 fL (ref 80.0–100.0)
Monocytes Absolute: 1.5 10*3/uL — ABNORMAL HIGH (ref 0.1–1.0)
Monocytes Relative: 11 %
Neutro Abs: 11.2 10*3/uL — ABNORMAL HIGH (ref 1.7–7.7)
Neutrophils Relative %: 77 %
Platelet Count: 659 10*3/uL — ABNORMAL HIGH (ref 150–400)
RBC: 3.83 MIL/uL — ABNORMAL LOW (ref 4.22–5.81)
RDW: 13.9 % (ref 11.5–15.5)
WBC Count: 14.5 10*3/uL — ABNORMAL HIGH (ref 4.0–10.5)
nRBC: 0 % (ref 0.0–0.2)

## 2020-07-22 LAB — LACTATE DEHYDROGENASE: LDH: 178 U/L (ref 98–192)

## 2020-07-22 MED ORDER — HEPARIN SOD (PORK) LOCK FLUSH 100 UNIT/ML IV SOLN
500.0000 [IU] | Freq: Once | INTRAVENOUS | Status: AC
  Filled 2020-07-22: qty 5

## 2020-07-22 MED ORDER — OXYCODONE HCL 5 MG PO TABS
5.0000 mg | ORAL_TABLET | Freq: Four times a day (QID) | ORAL | 0 refills | Status: DC | PRN
Start: 2020-07-22 — End: 2020-08-01

## 2020-07-22 MED ORDER — OXYCODONE HCL ER 20 MG PO T12A
20.0000 mg | EXTENDED_RELEASE_TABLET | Freq: Two times a day (BID) | ORAL | 0 refills | Status: DC
Start: 2020-07-22 — End: 2020-07-23

## 2020-07-22 MED ORDER — SODIUM CHLORIDE 0.9% FLUSH
10.0000 mL | Freq: Once | INTRAVENOUS | Status: AC
  Filled 2020-07-22: qty 10

## 2020-07-22 NOTE — Progress Notes (Signed)
Hematology and Oncology Follow Up Visit  Lee Christensen [id number] 1950/10/23 69 y.o. 07/22/2020   Principle Diagnosis:   Metastatic squamous cell carcinoma of the lung-right middle lung primary with liver and lymph node metastasis  Current Therapy:    Carboplatinum/Taxol/Keytruda --start on 07/31/2020     Interim History:  Lee Christensen is in for his first office visit.  I saw him at [facility] when I was on call.  He came in because of abdominal pain.  Ultimately, he was found to have a primary in the right middle lung.  This measured 7.6 x 5.2 cm.  Extends to the right hilum.  He had pretracheal lymph nodes that were enlarged.  He had innumerable small liver mets.  He then underwent upper endoscopy.  This was because he was not eating.  He had a little bit of a stricture in the esophagus.  He had distress.  This did not help his appetite.  He then had a biopsy of one of the liver lesions.  This was done on 07/16/2020.  The pathology report ([id number]) showed a non-small cell bronchogenic carcinoma that was felt to be squamous cell carcinoma.  We do not have the molecular markers back yet.  The big problem that he has is lack of appetite.  He is on Marinol.  I am unsure if this is helping as of yet.  Hopefully he will continue to take this.  I suspect that the cancer itself is probably causing him to have a decreased appetite.  He has been losing weight.  He is having a lot of abdominal pain.  I suspect this probably is the necrotic lymph nodes that are in the upper abdomen.  We will have to get him on OxyContin and oxycodone.  I think chronic pain will be an issue.  He has had no problems with cough.  He has had no fever.  Has had no headache.  There is been no leg swelling.  He has had no bleeding.  He has had no problems with diarrhea or constipation.  Overall, I would say his performance status is by ECOG 2.  Medications:  Current Outpatient Medications:  .  amLODipine  (NORVASC) 5 MG tablet, Take 5 mg by mouth daily. , Disp: , Rfl:  .  aspirin 81 MG tablet, Take 81 mg by mouth daily., Disp: , Rfl:  .  atorvastatin (LIPITOR) 20 MG tablet, Take 20 mg by mouth daily., Disp: , Rfl: 2 .  cholecalciferol (VITAMIN D3) 25 MCG (1000 UNIT) tablet, Take 1,000 Units by mouth daily., Disp: , Rfl:  .  Coenzyme Q10 (CO Q 10) 100 MG CAPS, Take 100 mg by mouth daily., Disp: , Rfl:  .  dronabinol (MARINOL) 5 MG capsule, Take 1 capsule (5 mg total) by mouth 2 (two) times daily before lunch and supper., Disp: 60 capsule, Rfl: 0 .  mirtazapine (REMERON) 45 MG tablet, Take 45 mg by mouth at bedtime., Disp: , Rfl:  .  Multiple Vitamin (MULTIVITAMIN) tablet, Take 1 tablet by mouth daily. Centrum Silver, Disp: , Rfl:  .  pantoprazole (PROTONIX) 40 MG tablet, Take 40 mg by mouth 2 (two) times daily., Disp: , Rfl:  .  Probiotic Product (ALIGN) 4 MG CAPS, Take 4 mg by mouth daily., Disp: , Rfl:  .  tamsulosin (FLOMAX) 0.4 MG CAPS capsule, Take 0.4 mg by mouth daily. , Disp: , Rfl:  .  terazosin (HYTRIN) 2 MG capsule, Take 2 mg by mouth at bedtime. ,  Disp: , Rfl:  .  Turmeric 500 MG CAPS, Take 500 mg by mouth daily. , Disp: , Rfl:  .  Wheat Dextrin (BENEFIBER PO), Take 1 Scoop by mouth daily. , Disp: , Rfl:  .  zinc gluconate 50 MG tablet, Take 50 mg by mouth daily., Disp: , Rfl:  .  acetaminophen (TYLENOL) 650 MG CR tablet, Take 650 mg by mouth every 8 (eight) hours as needed for pain (arthritis). , Disp: , Rfl:  .  alprazolam (XANAX) 2 MG tablet, Take 1 mg by mouth every 3 (three) hours. (Patient not taking: Reported on 07/22/2020), Disp: , Rfl:  .  Fluorouracil (TOLAK) 4 % CREA, Apply 1 application topically daily.  (Patient not taking: Reported on 07/22/2020), Disp: , Rfl:  .  gabapentin (NEURONTIN) 100 MG capsule, Take 100 mg by mouth daily as needed (Nerve pain).  (Patient not taking: Reported on 07/22/2020), Disp: , Rfl:  .  gabapentin (NEURONTIN) 600 MG tablet, Take 600 mg by mouth 3  (three) times daily.  (Patient not taking: Reported on 07/22/2020), Disp: , Rfl:  .  polyethylene glycol-electrolytes (TRILYTE) 420 g solution, Take 4,000 mLs by mouth as directed., Disp: 4000 mL, Rfl: 0 No current facility-administered medications for this visit.  Facility-Administered Medications Ordered in Other Visits:  .  heparin lock flush 100 unit/mL, 500 Units, Intravenous, Once, Lee Siddoway R, MD .  sodium chloride flush (NS) 0.9 % injection 10 mL, 10 mL, Intravenous, Once, Lee Christensen, Lee Cobb, MD  Allergies:  Allergies  Allergen Reactions  . Penicillins Rash    Has patient had a PCN reaction causing immediate rash, facial/tongue/throat swelling, SOB or lightheadedness with hypotension: yes Has patient had a PCN reaction causing severe rash involving mucus membranes or skin necrosis: no Has patient had a PCN reaction that required hospitalization: no Has patient had a PCN reaction occurring within the last 10 years:noIf all of the above answers are "NO", then may proceed with Cephalosporin use.     Past Medical History, Surgical history, Social history, and Family History were reviewed and updated.  Review of Systems: Review of Systems  Constitutional: Positive for appetite change and unexpected weight change.  HENT:  Negative.   Eyes: Negative.   Respiratory: Negative.   Cardiovascular: Negative.   Gastrointestinal: Positive for abdominal pain and nausea.  Endocrine: Negative.   Genitourinary: Negative.    Musculoskeletal: Negative.   Skin: Negative.   Neurological: Negative.   Hematological: Negative.   Psychiatric/Behavioral: Negative.     Physical Exam:  weight is 134 lb (60.8 kg). His oral temperature is 99.4 F (37.4 C). His blood pressure is 128/74 and his pulse is 100. His respiration is 18 and oxygen saturation is 98%.   Wt Readings from Last 3 Encounters:  07/22/20 134 lb (60.8 kg)  06/24/20 143 lb 12.8 oz (65.2 kg)  01/02/18 (S) 146 lb 1 oz (66.3 kg)     Physical Exam Vitals reviewed.  HENT:     Head: Normocephalic and atraumatic.  Eyes:     Pupils: Pupils are equal, round, and reactive to light.  Cardiovascular:     Rate and Rhythm: Normal rate and regular rhythm.     Heart sounds: Normal heart sounds.  Pulmonary:     Effort: Pulmonary effort is normal.     Breath sounds: Normal breath sounds.  Abdominal:     General: Bowel sounds are normal.     Palpations: Abdomen is soft.  Musculoskeletal:  General: No tenderness or deformity. Normal range of motion.     Cervical back: Normal range of motion.  Lymphadenopathy:     Cervical: No cervical adenopathy.  Skin:    General: Skin is warm and dry.     Findings: No erythema or rash.  Neurological:     Mental Status: He is alert and oriented to person, place, and time.  Psychiatric:        Behavior: Behavior normal.        Thought Content: Thought content normal.        Judgment: Judgment normal.     Lab Results  Component Value Date   WBC 14.5 (H) 07/22/2020   HGB 10.8 (L) 07/22/2020   HCT 33.9 (L) 07/22/2020   MCV 88.5 07/22/2020   PLT 659 (H) 07/22/2020     Chemistry      Component Value Date/Time   NA 138 07/22/2020 1449   K 4.4 07/22/2020 1449   CL 98 07/22/2020 1449   CO2 31 07/22/2020 1449   BUN 13 07/22/2020 1449   CREATININE 1.02 07/22/2020 1449      Component Value Date/Time   CALCIUM 10.9 (H) 07/22/2020 1449   ALKPHOS 170 (H) 07/22/2020 1449   AST 47 (H) 07/22/2020 1449   ALT 51 (H) 07/22/2020 1449   BILITOT 0.4 07/22/2020 1449      Impression and Plan: Mr. Mall is a very nice 69 year old white male.  He has metastatic squamous cell carcinoma of the lung.  His wife was with him.  His 2 daughters were on the cell phone listening.  Again, I told them that this is a cancer that we can treat but we cannot cure.  I think his problem clearly is going to be the weight and lack of appetite.  Again this might be from the cancer itself.  He  continues to lose weight, this will definitely be a problem.  Hopefully, he will be able to eat a little bit better with the Marinol.  Maybe, if we took care of the pain and got this little bit better, he will eat a little bit better.  I talked to them about the different recommendations.  We will consider chemoimmunotherapy.  I think the standard of care would be carboplatinum/Taxol/pembrolizumab.  I think this would be relatively standard of care.  Hopefully, he will respond to this protocol.  We will see what the molecular markers are.  I think this will be very important for [place].  I think the chance of having a actionable mutation is probably can be less than 5% however.  I talked to them about the side effects of treatment.  I explained to them the risk of infection.  I explained to them the possibility of nausea and vomiting, weight loss, losing the hair.  They understand all of this.  He will decide about treatment.  He will let us know.  His wife wanted to know how long he had if he did not take treatment.  I think that he probably would have about 4 months.  If he does not take treatment, then I think he would be a great candidate for hospice.  We will try to get him on treatment.  I will try to do our scan after the second cycle of treatment.  I think this would be reasonable.  I spent over an hour with him today.   Volanda Napoleon, MD 12/7/20215:00 PM

## 2020-07-22 NOTE — Progress Notes (Signed)
Initial RN Navigator Patient Visit  Name: Lee Christensen Date of Referral : Hospital Referral Diagnosis: Lung Cancer  Met with patient prior to their visit with MD. Hanley Seamen patient "Your Patient Navigator" handout which explains my role, areas in which I am able to help, and all the contact information for myself and the office. Also gave patient MD and Navigator business card. Reviewed with patient the general overview of expected course after initial diagnosis and time frame for all steps to be completed.  New patient packet given to patient which includes: orientation to office and staff; campus directory; education on My Chart and Advance Directives; and patient centered education on lung cancer.  Patient will complete visit with Dr Marin Olp and I will follow up tomorrow with patient regarding planned treatment plan.   Patient understands all follow up procedures and expectations. They have my number to reach out for any further clarification or additional needs.  Oncology Nurse Navigator Documentation  Oncology Nurse Navigator Flowsheets 07/22/2020  Navigator Follow Up Date: 07/23/2020  Navigator Follow Up Reason: Appointment Review  Navigator Location [facility]  Navigator Encounter Type Initial MedOnc  Patient Visit Type MedOnc  Treatment Phase Pre-Tx/Tx Discussion  Barriers/Navigation Needs Coordination of Care;Education  Education Newly Diagnosed Cancer Education;Preparing for Upcoming Surgery/ Treatment  Interventions Coordination of Care;Education;Other;Psycho-Social Support  Acuity Level 2-Minimal Needs (1-2 Barriers Identified)  Coordination of Care Other  Education Method Verbal;Written  Support Groups/Services Friends and Family  Time Spent with Patient 27

## 2020-07-22 NOTE — Progress Notes (Signed)
START ON PATHWAY REGIMEN - Non-Small Cell Lung     A cycle is every 21 days:     Pembrolizumab      Paclitaxel      Carboplatin   **Always confirm dose/schedule in your pharmacy ordering system**  Patient Characteristics: Stage IV Metastatic, Squamous, PS = 0, 1, First Line, PD-L1 Expression Positive 1-49% (TPS) / Negative / Not Tested / Awaiting Test Results and Immunotherapy Candidate Therapeutic Status: Stage IV Metastatic Histology: Squamous Cell Line of therapy: First Line ECOG Performance Status: 1 PD-L1 Expression Status: Awaiting Test Results Immunotherapy Candidate Status: Candidate for Immunotherapy Intent of Therapy: Non-Curative / Palliative Intent, Discussed with Patient

## 2020-07-23 ENCOUNTER — Encounter: Payer: Self-pay | Admitting: *Deleted

## 2020-07-23 ENCOUNTER — Telehealth: Payer: Self-pay

## 2020-07-23 ENCOUNTER — Other Ambulatory Visit: Payer: Self-pay | Admitting: *Deleted

## 2020-07-23 DIAGNOSIS — C787 Secondary malignant neoplasm of liver and intrahepatic bile duct: Secondary | ICD-10-CM

## 2020-07-23 LAB — CEA (IN HOUSE-CHCC): CEA (CHCC-In House): 29.57 ng/mL — ABNORMAL HIGH (ref 0.00–5.00)

## 2020-07-23 MED ORDER — XTAMPZA ER 18 MG PO C12A: 18.0000 mg | EXTENDED_RELEASE_CAPSULE | Freq: Two times a day (BID) | ORAL | 0 refills | Status: AC

## 2020-07-23 NOTE — Addendum Note (Signed)
Addended by: Burney Gauze R on: 07/23/2020 12:26 PM   Modules accepted: Orders

## 2020-07-23 NOTE — Progress Notes (Signed)
Reviewed appointment on 07/22/2020. Patient and family are considering treatment. They will make a decision by Monday.   Spoke to the patient's wife, Helene Kelp, to confirm their plan.   Patient needs MRI of the brain. At this time he doesn't want to schedule as he "just needs time to think". They will let me know when they are ready to schedule.   They were unable to pick up both medications for pain as the insurance had not paid on one of them. Called the pharmacy and found that the insurance will not cover Oxycontin. Preferred medication is Xtampza. Dr Marin Olp will send in a new prescription. Notified Helene Kelp. Reviewed both pain medication prescriptions and administration schedule for each.  Helene Kelp asked about FMLA. She would like to be written out of work completely until the beginning of the year. As this is unusual for caregivers, I confirmed with Dr Marin Olp that this was ok. He approved of the request. Helene Kelp instructed to bring the [facility] paperwork into the office.   A question regarding port was made. Spoke to Dr Marin Olp and he stated that we would start treatment without a port, but likely have one placed if patient continues on treatment.   Provided significant psychosocial support and answered questions about symptom management and end of life care.   Oncology Nurse Navigator Documentation  Oncology Nurse Navigator Flowsheets 07/23/2020  Navigator Follow Up Date: 07/28/2020  Navigator Follow Up Reason: Appointment Review  Navigator Location [facility]  Navigator Encounter Type Appt/Treatment Plan Review;Telephone  Telephone Outgoing Call;FMLA/Disability  Patient Visit Type MedOnc  Treatment Phase Pre-Tx/Tx Discussion  Barriers/Navigation Needs Coordination of Care;Education;Personal Conflicts;Anxiety  Education Other  Interventions Coordination of Care;Disability/FMLA;Education;Medication Assistance;Psycho-Social Support  Acuity Level 3-Moderate Needs (3-4 Barriers Identified)   Coordination of Care Other  Education Method Verbal;Teach-back  Support Groups/Services Friends and Family  Time Spent with Patient 50

## 2020-07-23 NOTE — Telephone Encounter (Signed)
12/7 los states pt needs a return visit along with chemo. At the time of scheduling there was no tx plan, per Roselyn Reef and Vanessa-secure chat/inbasket the pt now req to think about the chemo and not schedule at this time and will let us know by 07/28/20.  Kenney Houseman is also aware... AOM

## 2020-07-25 ENCOUNTER — Other Ambulatory Visit: Payer: Self-pay | Admitting: Pharmacist

## 2020-07-25 NOTE — Progress Notes (Signed)
Ful[place] changed to [place] per insurance requirement.

## 2020-07-29 ENCOUNTER — Encounter: Payer: Self-pay | Admitting: *Deleted

## 2020-07-29 DIAGNOSIS — C3490 Malignant neoplasm of unspecified part of unspecified bronchus or lung: Secondary | ICD-10-CM | POA: Diagnosis not present

## 2020-07-29 NOTE — Progress Notes (Signed)
Spoke with wife yesterday and today regarding patient decisions and condition.  Patient has decided that he doesn't want treatment. He isn't eating or drinking much. Lee Christensen, patient's wife, states he drink about 4 Boosts per day. He's very reluctant to eat or drink otherwise.  Reviewed end of life, and how food/liquid needs change. Patient did try Marinol, but the wife thinks it caused hallucinations and confusion and therefor stopped it. She is worried she's not doing enough to help patient eat. Assured her that she is doing everything she can, and that the patient is in control in regards to their intake. She did ask that I reach out to nutrition to see how many Boost would be necessary to meet nutritional demand. Patient would need six 360 calorie boost per day.  Offered to place a referral to hospice, but at this time she would like to hold. She is worried that adding someone from outside the home would add to patient's agitation and frustration. Told her to notify us if/when she decided she would like the referral.   Yesterday she mentioned that patient had gone almost a week without a BM. We discussed the use of Miralax BID. She increased his dose, and this morning he did have a good BM.  Patient is more hesitant to take medication. We reviewed his med list and what medication would be more important to take than others. Higher priority include those that treat pain and anxiety.  While patient's confusion has been attributed to the Marinol, Lee Christensen also questions whether the pain medicine may be to blame. I explained that this was possible. They would like Dr Lee Christensen to weigh in on the matter and possibly consider switching from Oxy based medications to fentanyl or morphine.   Of note, Lee Christensen stated that she was having her son come to the home to collect the guns. Apparently patient has several loaded weapons in the home and with his confusion and frustration she's worried what he might do.  Confirmed with her that this was a good plan to keep the patient and her safe.  She is going to call me as needed.    All the above reviewed with Dr Lee Christensen. Will follow up tomorrow regarding the pain management.   Oncology Nurse Navigator Documentation  Oncology Nurse Navigator Flowsheets 07/29/2020  Navigator Follow Up Date: -  Navigator Follow Up Reason: Paediatric nurse Encounter Type Telephone  Telephone Outgoing Call;Patient Update  Patient Visit Type MedOnc  Treatment Phase Other  Barriers/Navigation Needs Coordination of Care;Education;Personal Conflicts;Anxiety  Education Other  Interventions Coordination of Care;Psycho-Social Support  Acuity Level 3-Moderate Needs (3-4 Barriers Identified)  Coordination of Care -  Education Method Verbal  Support Groups/Services Friends and Family  Time Spent with Patient 72

## 2020-07-30 ENCOUNTER — Encounter: Payer: Self-pay | Admitting: *Deleted

## 2020-07-30 ENCOUNTER — Other Ambulatory Visit: Payer: Self-pay | Admitting: *Deleted

## 2020-07-30 MED ORDER — FENTANYL 25 MCG/HR TD PT72
1.0000 | MEDICATED_PATCH | TRANSDERMAL | 0 refills | Status: AC
Start: 2020-07-30 — End: 2020-08-02

## 2020-07-30 NOTE — Progress Notes (Signed)
Spoke to Dr Marin Olp who wants patient to try a fentanyl patch. Prescription called in.  Called pharmacy after several hours and confirmed that prescription was filed and approved through insurance.   Called and spoke to [place], patient's daughter, and notified her of patch prescription. Instructed her to give patient one more dose of oxycontin this evening and then stop. They will place the patch today. Reviewed patch placement, safety concerns, and things that can be done to help adhesion.   Kayla and patient's wife knows to reach out with any questions or needs.   Oncology Nurse Navigator Documentation  Oncology Nurse Navigator Flowsheets 07/30/2020  Navigator Follow Up Date: -  Navigator Follow Up Reason: -  Navigator Location [facility]  Navigator Encounter Type Telephone  Telephone Symptom Mgt;Outgoing Call  Patient Visit Type MedOnc  Treatment Phase Other  Barriers/Navigation Needs Coordination of Care;Education;Personal Conflicts;Anxiety  Education Pain/ Symptom Management  Interventions Coordination of Care;Education;Psycho-Social Support  Acuity Level 3-Moderate Needs (3-4 Barriers Identified)  Coordination of Care Other  Education Method Verbal  Support Groups/Services Friends and Family  Time Spent with Patient 80

## 2020-07-31 ENCOUNTER — Encounter: Payer: Self-pay | Admitting: *Deleted

## 2020-07-31 DIAGNOSIS — C787 Secondary malignant neoplasm of liver and intrahepatic bile duct: Secondary | ICD-10-CM

## 2020-07-31 NOTE — Progress Notes (Signed)
Received phone call from patient's daughter Lee Christensen. Patient continues to become more confused and agitated. He is having issues remembering family members, he seems lost in his home. He is confused about routine surrounding his ADLs. He is starting to refuse more food (down to one Boost), limiting his water and questioning the medications being given to him. They state he spends most of the day sleeping, difficult to rouse, but then at night he is more agitated.   Reviewed Hospice services again with [facility]. Explained how they can come to the home and give an overview of their program. Educated that Dr Marin Olp will remain their physician and that they can cancel hospice at anytime if they change their mind. Encouraged them to try Hospice as they may provide extra support and resources for the family, as well as symptom management and increased quality of life for the patient. They agreed to the referral.  Called and spoke to Norge at [facility]. Referral for Hospice made with Lee Christensen, the patient's wife, being the contact. Order placed.   Oncology Nurse Navigator Documentation  Oncology Nurse Navigator Flowsheets 07/31/2020  Navigator Follow Up Date: -  Navigator Follow Up Reason: Paediatric nurse Encounter Type Telephone  Telephone Incoming Call;Patient Update;Symptom Mgt  Patient Visit Type MedOnc  Treatment Phase Other  Barriers/Navigation Needs Coordination of Care;Education;Personal Conflicts;Anxiety  Education Other  Interventions Coordination of Care;Education;Psycho-Social Support;Referrals  Acuity Level 3-Moderate Needs (3-4 Barriers Identified)  Referrals Other  Coordination of Care Other  Education Method Verbal  Support Groups/Services Friends and Family  Time Spent with Patient 16

## 2020-08-01 ENCOUNTER — Other Ambulatory Visit: Payer: Self-pay | Admitting: *Deleted

## 2020-08-01 ENCOUNTER — Encounter: Payer: Self-pay | Admitting: *Deleted

## 2020-08-01 DIAGNOSIS — C3490 Malignant neoplasm of unspecified part of unspecified bronchus or lung: Secondary | ICD-10-CM

## 2020-08-01 DIAGNOSIS — C787 Secondary malignant neoplasm of liver and intrahepatic bile duct: Secondary | ICD-10-CM

## 2020-08-01 MED ORDER — OXYCODONE HCL 5 MG PO TABS: 10.0000 mg | ORAL_TABLET | ORAL | 0 refills | Status: AC | PRN

## 2020-08-01 NOTE — Progress Notes (Signed)
Received a call from patient's daughter Lee Christensen. Patient's pain needs have been increased today. He had the patch placed at 5pm yesterday and Oxycontin was discontinued. Today hes had significant pain. They've been giving him oxycodone 5mg  every 3-4hours vs the every 6 hours it was prescribed. They would like to know what to do. Of note, hospice is coming to their home tomorrow at 1pm.  Spoke with Dr Marin Olp. He wants oxycodone increased to 10mg  q4h. Reviewed new orders with daughter Lee Christensen. New prescription sent in. Pharmacy confirmed.   Oncology Nurse Navigator Documentation  Oncology Nurse Navigator Flowsheets 08/01/2020  Navigator Follow Up Date: -  Navigator Follow Up Reason: -  Navigator Location [facility]  Navigator Encounter Type Telephone  Telephone Patient Update;Symptom Mgt;Incoming Call  Patient Visit Type MedOnc  Treatment Phase Other  Barriers/Navigation Needs Coordination of Care;Education;Personal Conflicts;Anxiety  Education Other  Interventions Coordination of Care;Education;Medication Assistance;Psycho-Social Support  Acuity Level 4-High Needs (Greater Than 4 Barriers Identified)  Referrals -  Coordination of Care Other  Education Method Verbal  Support Groups/Services Friends and Family  Time Spent with Patient 69

## 2020-08-06 ENCOUNTER — Telehealth: Payer: Self-pay | Admitting: Hematology & Oncology

## 2020-08-12 ENCOUNTER — Other Ambulatory Visit: Payer: Self-pay | Admitting: Hematology & Oncology

## 2020-08-16 NOTE — Telephone Encounter (Signed)
Patty with [facility] called to advise that this patient died on Aug 15, 2020 @ 9:02am

## 2020-08-16 DEATH — deceased

## 2020-09-17 ENCOUNTER — Ambulatory Visit: Payer: Medicare HMO | Admitting: Dermatology

## 2021-06-10 IMAGING — CT CT ABD-PELV W/ CM
2 of 5 series · 15 of 46 positions shown, 17 images · IV contrast (APPLIED)
Comparison: 07/12/2020

CLINICAL DATA: Cancer of unknown primary.

EXAM:
CT ABDOMEN AND PELVIS WITH CONTRAST
TECHNIQUE: Multidetector CT imaging of the abdomen and pelvis was performed
using the standard protocol following bolus administration of
intravenous contrast.
CONTRAST:  100mL OMNIPAQUE IOHEXOL 300 MG/ML  SOLN

[Series 2: axial st · axial · 0.71mm/px · z∈[+883,+1278]mm · 12 of 91 slices shown, 14 images]
[im 6/91  soft-tissue]
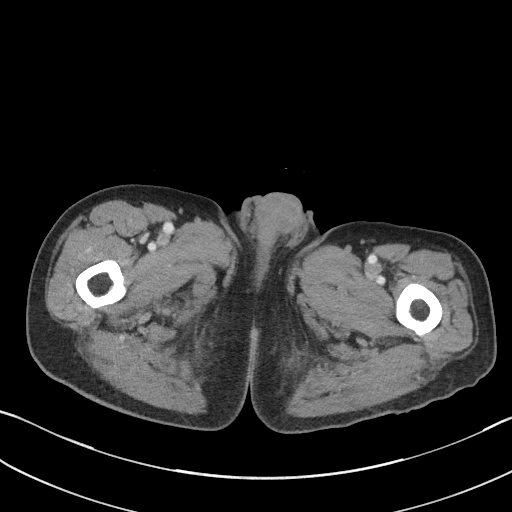
[im 6/91  bone]
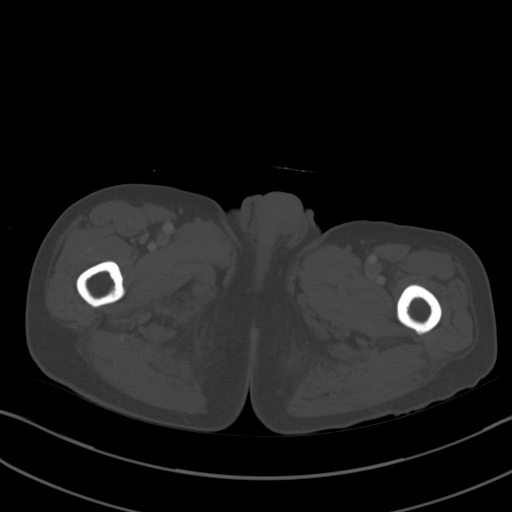
[im 12/91  soft-tissue]
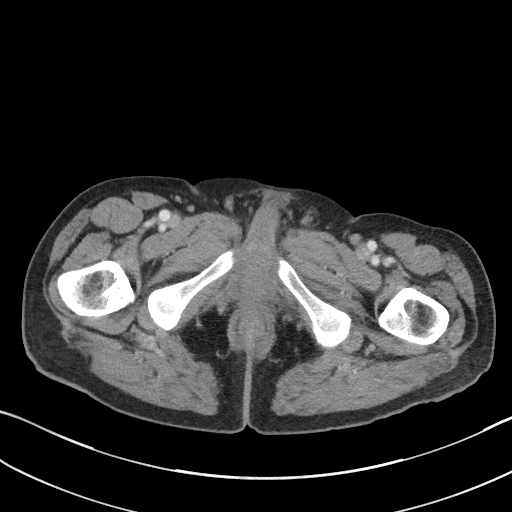
[im 23/91  soft-tissue]
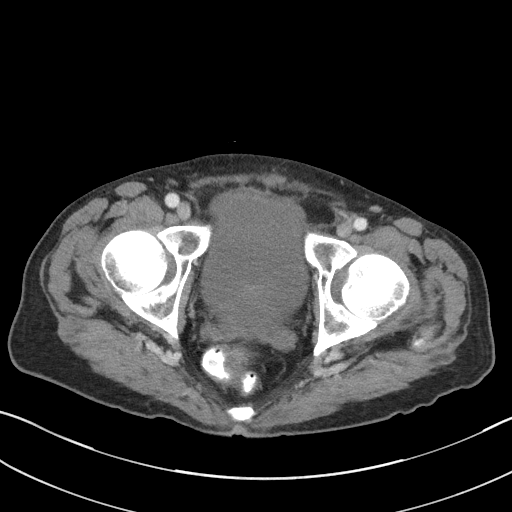
[im 29/91  soft-tissue]
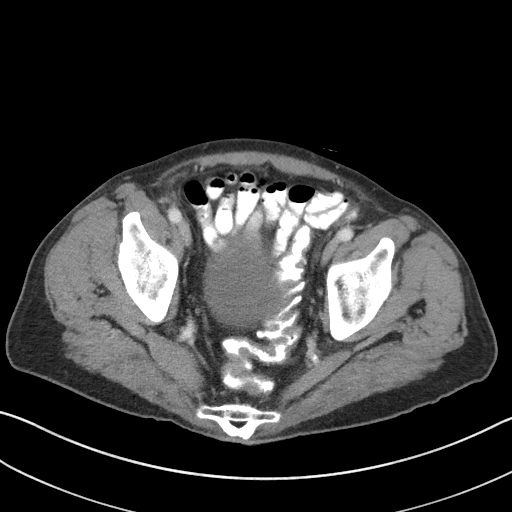
[im 34/91  soft-tissue]
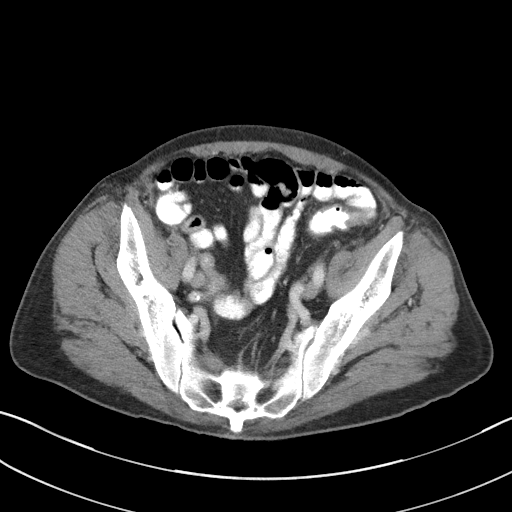
[im 40/91  soft-tissue]
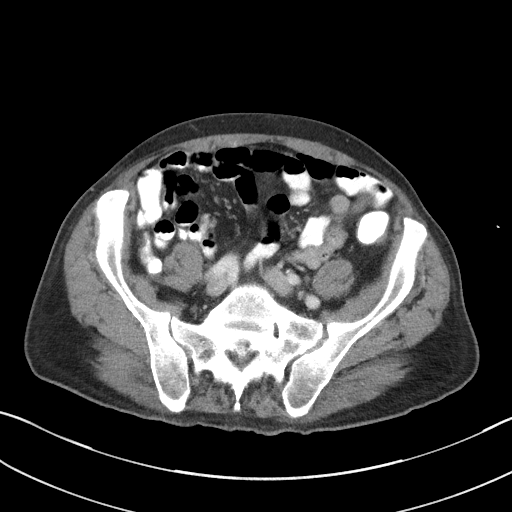
[im 51/91  soft-tissue]
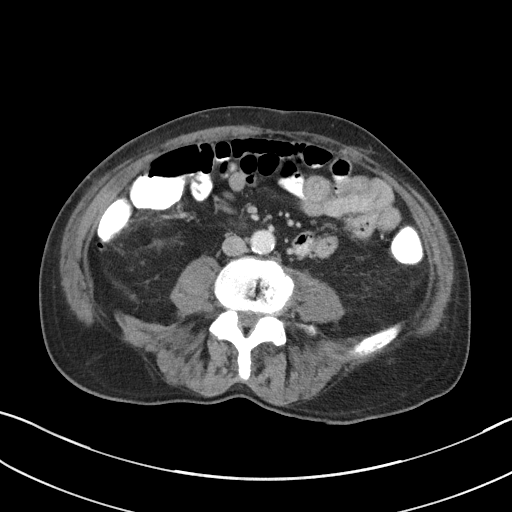
[im 57/91  soft-tissue]
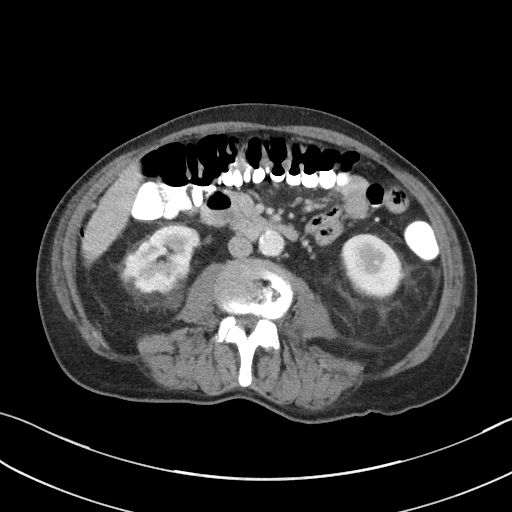
[im 62/91  soft-tissue]
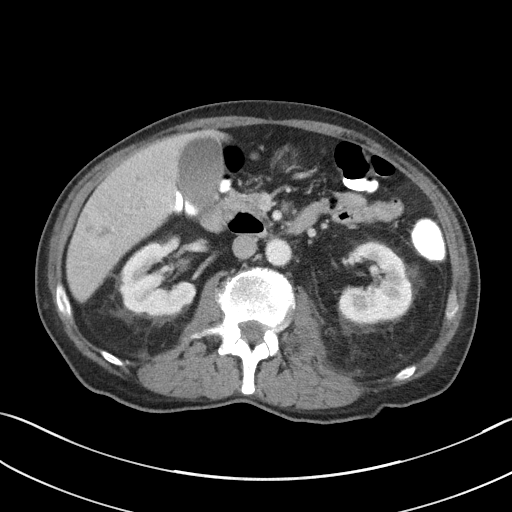
[im 62/91  bone]
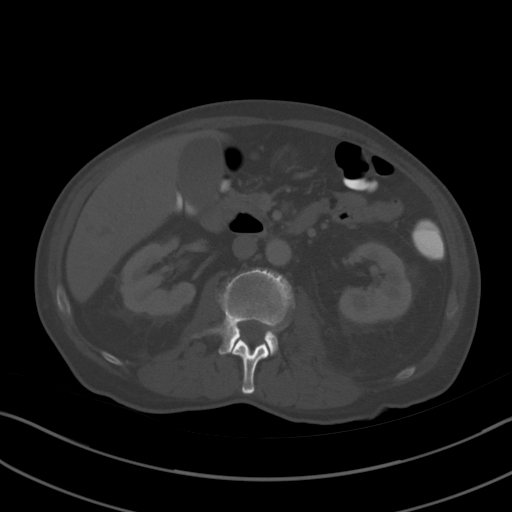
[im 68/91  soft-tissue]
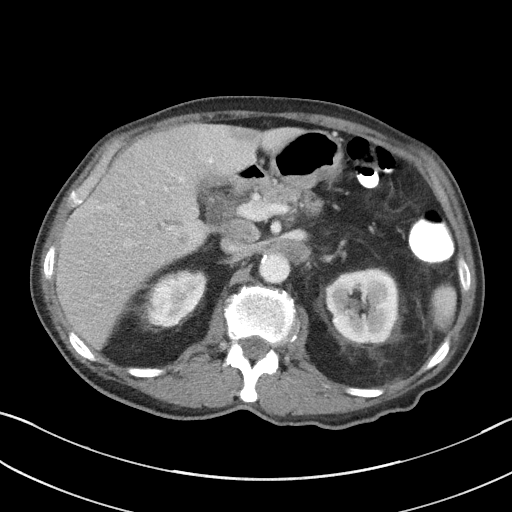
[im 79/91  soft-tissue]
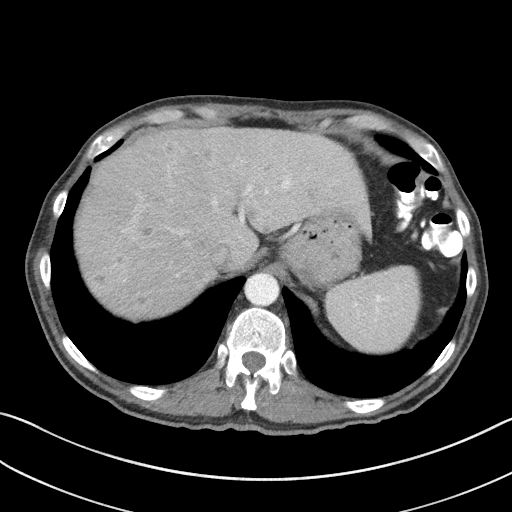
[im 85/91  soft-tissue]
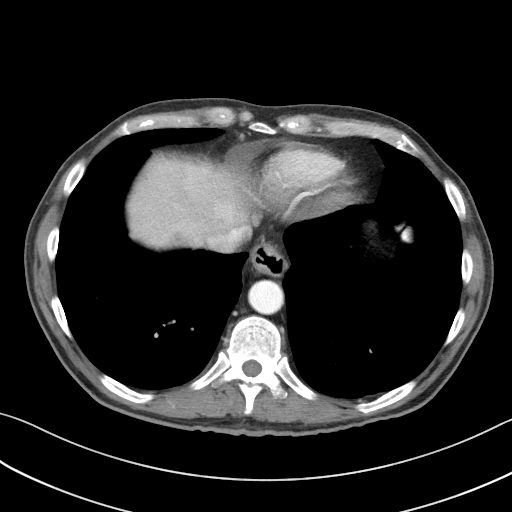

[Series 4: coronal st · coronal · 0.64mm/px · 3 of 82 slices shown]
[im 28/82  soft-tissue]
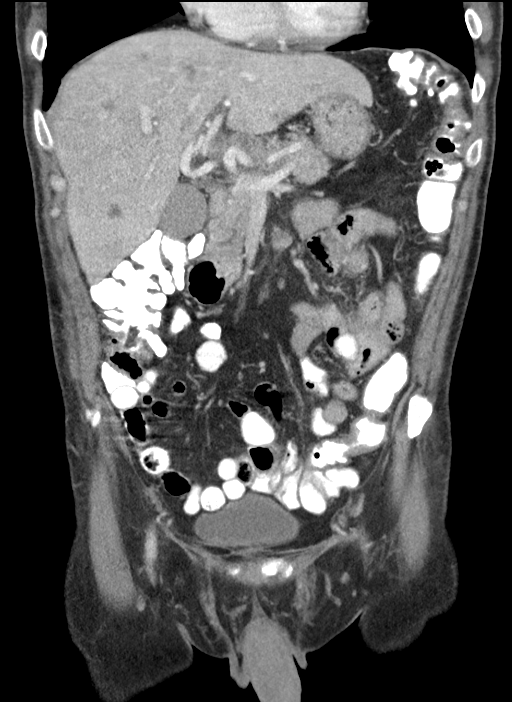
[im 37/82  soft-tissue]
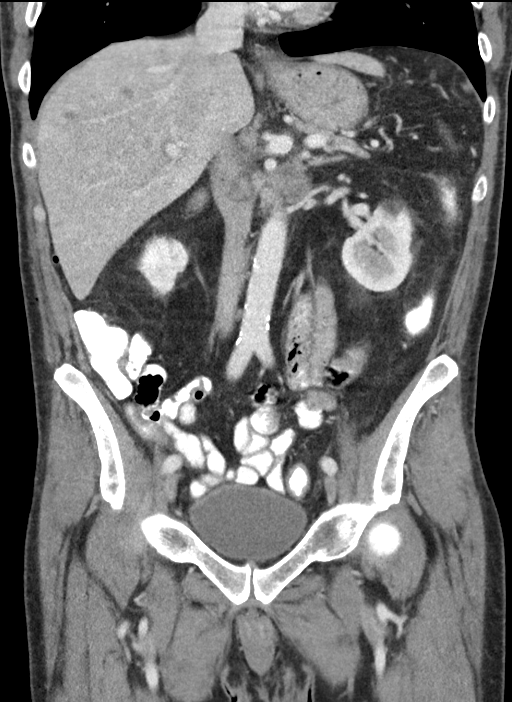
[im 46/82  soft-tissue]
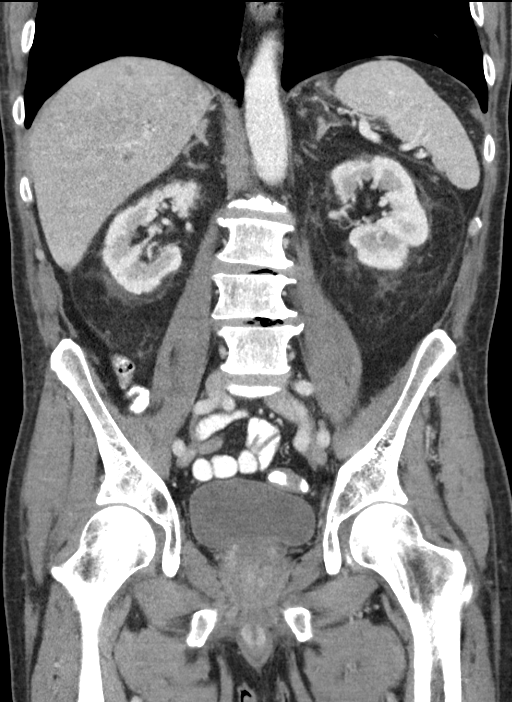

[15 of 46 positions shown; findings below may reference images not displayed]

FINDINGS: Lower chest: Unremarkable.

Hepatobiliary: As seen on previous noncontrast CT, innumerable tiny
ill-defined hypoattenuating liver lesions are evident, suspicious
for metastatic disease. Dominant lesion is in the hepatic dome
adjacent to the right hepatic vein measuring 1.8 cm on image
8/series 2. Small area of low attenuation in the anterior liver,
adjacent to the falciform ligament, is in a characteristic location
for focal fatty deposition. There is no evidence for gallstones,
gallbladder wall thickening, or pericholecystic fluid. No
intrahepatic or extrahepatic biliary dilation.

Pancreas: No focal mass lesion. No dilatation of the main duct. No
intraparenchymal cyst. No peripancreatic edema.

Spleen: No splenomegaly. No focal mass lesion.

Adrenals/Urinary Tract: No adrenal nodule or mass. Cortical scarring
noted in both kidneys 14 mm exophytic cyst noted posterior lower
pole right kidney. No evidence for hydroureter. The urinary bladder
appears normal for the degree of distention.

Stomach/Bowel: Tiny hiatal hernia. Stomach otherwise unremarkable.
Duodenum is normally positioned as is the ligament of Treitz.
Duodenal diverticulum noted. No small bowel wall thickening. No
small bowel dilatation. The terminal ileum is normal. The appendix
is normal. No gross colonic mass. No colonic wall thickening.
Diverticular changes are noted in the left colon without evidence of
diverticulitis.

Vascular/Lymphatic: There is abdominal aortic atherosclerosis
without aneurysm. Ill-defined necrotic lymphadenopathy is seen in
the hepatoduodenal ligament measuring up to 19 mm short axis. This
generates mass-effect on the main portal vein. Portal vein, superior
mesenteric vein, and splenic vein are patent. The 14 mm short axis
portal caval node seen on [DATE]. 15 mm short axis left para-aortic
node identified posterior to the left renal vein on [DATE]. No pelvic
sidewall lymphadenopathy.

Reproductive: The prostate gland and seminal vesicles are
unremarkable.

Other: Trace free fluid in the pelvis.

Musculoskeletal: No worrisome lytic or sclerotic osseous
abnormality.
IMPRESSION: 1. Innumerable tiny ill-defined hypoattenuating liver lesions,
suspicious for metastatic disease. Dominant lesion is in the hepatic
dome adjacent to the right hepatic vein.
2. Necrotic lymphadenopathy in the hepatoduodenal ligament and
retroperitoneal space of the abdomen, also consistent with
metastatic involvement.
3. No primary source of metastatic disease identified on the current
study. Specifically, no evidence for GI tract pancreatic lesion.
4. Tiny hiatal hernia.
5. Aortic Atherosclerosis ([id number].B).
